# Patient Record
Sex: Male | Born: 1985 | Race: White | Hispanic: No | Marital: Single | Smoking: Former smoker
Health system: Southern US, Community
[De-identification: ages and names within clinical notes are randomized; demographics above are authoritative.]

## PROBLEM LIST (undated history)

## (undated) DIAGNOSIS — R32 Unspecified urinary incontinence: Secondary | ICD-10-CM

## (undated) DIAGNOSIS — Q239 Congenital malformation of aortic and mitral valves, unspecified: Secondary | ICD-10-CM

## (undated) DIAGNOSIS — Q238 Other congenital malformations of aortic and mitral valves: Secondary | ICD-10-CM

## (undated) DIAGNOSIS — G4700 Insomnia, unspecified: Secondary | ICD-10-CM

## (undated) DIAGNOSIS — K5090 Crohn's disease, unspecified, without complications: Secondary | ICD-10-CM

## (undated) DIAGNOSIS — K2960 Other gastritis without bleeding: Secondary | ICD-10-CM

## (undated) DIAGNOSIS — I351 Nonrheumatic aortic (valve) insufficiency: Secondary | ICD-10-CM

## (undated) DIAGNOSIS — I10 Essential (primary) hypertension: Secondary | ICD-10-CM

## (undated) DIAGNOSIS — K279 Peptic ulcer, site unspecified, unspecified as acute or chronic, without hemorrhage or perforation: Secondary | ICD-10-CM

## (undated) DIAGNOSIS — K449 Diaphragmatic hernia without obstruction or gangrene: Secondary | ICD-10-CM

## (undated) DIAGNOSIS — G43909 Migraine, unspecified, not intractable, without status migrainosus: Secondary | ICD-10-CM

## (undated) DIAGNOSIS — K219 Gastro-esophageal reflux disease without esophagitis: Secondary | ICD-10-CM

## (undated) DIAGNOSIS — R3915 Urgency of urination: Secondary | ICD-10-CM

## (undated) HISTORY — PX: COLONOSCOPY: SHX174

## (undated) HISTORY — PX: ESOPHAGOGASTRODUODENOSCOPY: SHX1529

## (undated) HISTORY — DX: Unspecified urinary incontinence: R32

## (undated) HISTORY — DX: Other gastritis without bleeding: K29.60

## (undated) HISTORY — PX: APPENDECTOMY: SHX54

## (undated) HISTORY — DX: Insomnia, unspecified: G47.00

## (undated) HISTORY — DX: Migraine, unspecified, not intractable, without status migrainosus: G43.909

## (undated) HISTORY — DX: Diaphragmatic hernia without obstruction or gangrene: K44.9

## (undated) HISTORY — DX: Crohn's disease, unspecified, without complications: K50.90

## (undated) HISTORY — DX: Urgency of urination: R39.15

---
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
## 2006-08-08 ENCOUNTER — Emergency Department (HOSPITAL_COMMUNITY): Admission: EM | Admit: 2006-08-08 | Discharge: 2006-08-08 | Payer: Self-pay | Admitting: Emergency Medicine

## 2010-07-03 HISTORY — PX: CHOLECYSTECTOMY: SHX55

## 2012-04-24 ENCOUNTER — Encounter (HOSPITAL_COMMUNITY): Payer: Self-pay | Admitting: *Deleted

## 2012-04-24 ENCOUNTER — Inpatient Hospital Stay (HOSPITAL_COMMUNITY)
Admission: AD | Admit: 2012-04-24 | Discharge: 2012-04-28 | DRG: 392 | Disposition: A | Discharge status: Home / Self Care | Payer: 59 | Source: Other Acute Inpatient Hospital | Attending: Internal Medicine | Admitting: Internal Medicine

## 2012-04-24 DIAGNOSIS — K5000 Crohn's disease of small intestine without complications: Secondary | ICD-10-CM | POA: Diagnosis present

## 2012-04-24 DIAGNOSIS — K219 Gastro-esophageal reflux disease without esophagitis: Secondary | ICD-10-CM | POA: Diagnosis present

## 2012-04-24 DIAGNOSIS — K5289 Other specified noninfective gastroenteritis and colitis: Principal | ICD-10-CM | POA: Diagnosis present

## 2012-04-24 DIAGNOSIS — I10 Essential (primary) hypertension: Secondary | ICD-10-CM | POA: Diagnosis present

## 2012-04-24 HISTORY — DX: Nonrheumatic aortic (valve) insufficiency: I35.1

## 2012-04-24 HISTORY — DX: Congenital malformation of aortic and mitral valves, unspecified: Q23.9

## 2012-04-24 HISTORY — DX: Peptic ulcer, site unspecified, unspecified as acute or chronic, without hemorrhage or perforation: K27.9

## 2012-04-24 HISTORY — DX: Gastro-esophageal reflux disease without esophagitis: K21.9

## 2012-04-24 HISTORY — DX: Other congenital malformations of aortic and mitral valves: Q23.8

## 2012-04-24 HISTORY — DX: Essential (primary) hypertension: I10

## 2012-04-24 MED ORDER — MORPHINE SULFATE 2 MG/ML IJ SOLN
2.0000 mg | INTRAMUSCULAR | Status: DC | PRN
  Administered 2012-04-24 – 2012-04-26 (×2): 2 mg via INTRAVENOUS
  Filled 2012-04-24 (×2): qty 1

## 2012-04-24 MED ORDER — PANTOPRAZOLE SODIUM 40 MG IV SOLR
40.0000 mg | Freq: Every day | INTRAVENOUS | Status: DC
  Administered 2012-04-24 – 2012-04-26 (×3): 40 mg via INTRAVENOUS
  Filled 2012-04-24 (×4): qty 40

## 2012-04-25 ENCOUNTER — Encounter (HOSPITAL_COMMUNITY): Payer: Self-pay | Admitting: Internal Medicine

## 2012-04-25 ENCOUNTER — Observation Stay (HOSPITAL_COMMUNITY): Payer: 59

## 2012-04-25 DIAGNOSIS — I10 Essential (primary) hypertension: Secondary | ICD-10-CM | POA: Diagnosis present

## 2012-04-25 DIAGNOSIS — K219 Gastro-esophageal reflux disease without esophagitis: Secondary | ICD-10-CM | POA: Diagnosis present

## 2012-04-25 DIAGNOSIS — K5000 Crohn's disease of small intestine without complications: Secondary | ICD-10-CM | POA: Diagnosis present

## 2012-04-25 LAB — COMPREHENSIVE METABOLIC PANEL
ALT: 20 U/L (ref 0–53)
ALT: 22 U/L (ref 0–53)
Albumin: 2.9 g/dL — ABNORMAL LOW (ref 3.5–5.2)
Alkaline Phosphatase: 49 U/L (ref 39–117)
Alkaline Phosphatase: 51 U/L (ref 39–117)
BUN: 7 mg/dL (ref 6–23)
CO2: 25 mEq/L (ref 19–32)
CO2: 27 mEq/L (ref 19–32)
Chloride: 100 mEq/L (ref 96–112)
Chloride: 103 mEq/L (ref 96–112)
GFR calc Af Amer: >90 mL/min (ref >90)
GFR calc Af Amer: >90 mL/min (ref >90)
GFR calc non Af Amer: >90 mL/min (ref >90)
Glucose, Bld: 95 mg/dL (ref 70–99)
Potassium: 3.5 mEq/L (ref 3.5–5.1)
Sodium: 135 mEq/L (ref 135–145)
Sodium: 139 mEq/L (ref 135–145)
Total Bilirubin: 0.5 mg/dL (ref 0.3–1.2)
Total Protein: 6.6 g/dL (ref 6.0–8.3)
Total Protein: 6.9 g/dL (ref 6.0–8.3)

## 2012-04-25 LAB — CBC WITH DIFFERENTIAL/PLATELET
Basophils Absolute: 0 10*3/uL (ref 0.0–0.1)
Basophils Relative: 0 % (ref 0–1)
Eosinophils Absolute: 0 10*3/uL (ref 0.0–0.7)
Hemoglobin: 13.4 g/dL (ref 13.0–17.0)
Lymphs Abs: 1.1 10*3/uL (ref 0.7–4.0)
MCV: 93.4 fL (ref 78.0–100.0)
Monocytes Relative: 18 % — ABNORMAL HIGH (ref 3–12)
Platelets: 138 10*3/uL — ABNORMAL LOW (ref 150–400)
RBC: 4.23 MIL/uL (ref 4.22–5.81)

## 2012-04-25 LAB — CBC
HCT: 40.4 % (ref 39.0–52.0)
MCHC: 34.7 g/dL (ref 30.0–36.0)
Platelets: 156 10*3/uL (ref 150–400)
RDW: 11.7 % (ref 11.5–15.5)
WBC: 16.7 10*3/uL — ABNORMAL HIGH (ref 4.0–10.5)

## 2012-04-25 LAB — URINALYSIS, ROUTINE W REFLEX MICROSCOPIC
Hgb urine dipstick: NEGATIVE
Nitrite: POSITIVE — AB
Protein, ur: 30 mg/dL — AB
Specific Gravity, Urine: 1.037 — ABNORMAL HIGH (ref 1.005–1.030)
Urobilinogen, UA: 1 mg/dL (ref 0.0–1.0)

## 2012-04-25 LAB — URINE MICROSCOPIC-ADD ON

## 2012-04-25 LAB — HIV ANTIBODY (ROUTINE TESTING W REFLEX): HIV: NONREACTIVE

## 2012-04-25 LAB — MAGNESIUM: Magnesium: 2 mg/dL (ref 1.5–2.5)

## 2012-04-25 MED ORDER — CHLORHEXIDINE GLUCONATE 0.12 % MT SOLN: 15.0000 mL | Freq: Two times a day (BID) | OROMUCOSAL | Status: DC

## 2012-04-25 MED ORDER — ONDANSETRON HCL 4 MG PO TABS
4.0000 mg | ORAL_TABLET | Freq: Four times a day (QID) | ORAL | Status: DC | PRN
  Administered 2012-04-25 – 2012-04-27 (×2): 4 mg via ORAL
  Filled 2012-04-25 (×2): qty 1

## 2012-04-25 MED ORDER — PEG 3350-KCL-NA BICARB-NACL 420 G PO SOLR
4000.0000 mL | Freq: Once | ORAL | Status: AC
  Administered 2012-04-25: 4000 mL via ORAL
  Filled 2012-04-25: qty 4000

## 2012-04-25 MED ORDER — SODIUM CHLORIDE 0.9 % IV SOLN: INTRAVENOUS | Status: DC

## 2012-04-25 MED ORDER — SODIUM PHOSPHATE 3 MMOLE/ML IV SOLN
20.0000 mmol | Freq: Once | INTRAVENOUS | Status: DC
  Administered 2012-04-25: 20 mmol via INTRAVENOUS
  Filled 2012-04-25: qty 6.67

## 2012-04-25 MED ORDER — ACETAMINOPHEN 650 MG RE SUPP: 650.0000 mg | Freq: Four times a day (QID) | RECTAL | Status: DC | PRN

## 2012-04-25 MED ORDER — ONDANSETRON HCL 4 MG/2ML IJ SOLN
4.0000 mg | Freq: Four times a day (QID) | INTRAMUSCULAR | Status: DC | PRN
  Administered 2012-04-25 – 2012-04-26 (×3): 4 mg via INTRAVENOUS
  Filled 2012-04-25 (×3): qty 2

## 2012-04-25 MED ORDER — ENOXAPARIN SODIUM 40 MG/0.4ML ~~LOC~~ SOLN
40.0000 mg | Freq: Every day | SUBCUTANEOUS | Status: DC
Start: 2012-04-25 — End: 2012-04-28
  Administered 2012-04-25 – 2012-04-28 (×4): 40 mg via SUBCUTANEOUS
  Filled 2012-04-25 (×4): qty 0.4

## 2012-04-25 MED ORDER — POTASSIUM CHLORIDE IN NACL 20-0.9 MEQ/L-% IV SOLN
INTRAVENOUS | Status: AC
  Administered 2012-04-25 (×2): via INTRAVENOUS
  Administered 2012-04-26: 1000 mL via INTRAVENOUS
  Administered 2012-04-27: 06:00:00 via INTRAVENOUS
  Filled 2012-04-25 (×6): qty 1000

## 2012-04-25 MED ORDER — DOCUSATE SODIUM 100 MG PO CAPS
100.0000 mg | ORAL_CAPSULE | Freq: Two times a day (BID) | ORAL | Status: DC
  Administered 2012-04-25 – 2012-04-28 (×4): 100 mg via ORAL
  Filled 2012-04-25 (×6): qty 1

## 2012-04-25 MED ORDER — PROMETHAZINE HCL 25 MG/ML IJ SOLN
12.5000 mg | Freq: Four times a day (QID) | INTRAMUSCULAR | Status: DC | PRN
  Administered 2012-04-26: 12.5 mg via INTRAVENOUS
  Filled 2012-04-25 (×2): qty 1

## 2012-04-25 MED ORDER — IOHEXOL 300 MG/ML  SOLN
80.0000 mL | Freq: Once | INTRAMUSCULAR | Status: AC | PRN
  Administered 2012-04-25: 80 mL via INTRAVENOUS

## 2012-04-25 MED ORDER — SODIUM CHLORIDE 0.9 % IJ SOLN
3.0000 mL | Freq: Two times a day (BID) | INTRAMUSCULAR | Status: DC
  Administered 2012-04-26 (×2): 3 mL via INTRAVENOUS

## 2012-04-25 MED ORDER — ACETAMINOPHEN 325 MG PO TABS: 650.0000 mg | ORAL_TABLET | Freq: Four times a day (QID) | ORAL | Status: DC | PRN

## 2012-04-25 MED ORDER — HYDROCODONE-ACETAMINOPHEN 5-325 MG PO TABS
1.0000 | ORAL_TABLET | ORAL | Status: DC | PRN
  Administered 2012-04-25 – 2012-04-26 (×3): 2 via ORAL
  Administered 2012-04-27: 1 via ORAL
  Administered 2012-04-27: 2 via ORAL
  Administered 2012-04-28: 1 via ORAL
  Filled 2012-04-25 (×3): qty 2
  Filled 2012-04-25: qty 1
  Filled 2012-04-25 (×2): qty 2

## 2012-04-25 MED ORDER — IOHEXOL 300 MG/ML  SOLN
20.0000 mL | INTRAMUSCULAR | Status: AC
  Administered 2012-04-25 (×2): 20 mL via ORAL

## 2012-04-25 MED ORDER — METRONIDAZOLE IN NACL 5-0.79 MG/ML-% IV SOLN
500.0000 mg | Freq: Three times a day (TID) | INTRAVENOUS | Status: DC
  Administered 2012-04-25 – 2012-04-27 (×8): 500 mg via INTRAVENOUS
  Filled 2012-04-25 (×10): qty 100

## 2012-04-25 MED ORDER — SODIUM CHLORIDE 0.9 % IV SOLN
INTRAVENOUS | Status: DC
  Administered 2012-04-25: 03:00:00 via INTRAVENOUS

## 2012-04-25 MED ORDER — SODIUM GLYCEROPHOSPHATE 1 MMOLE/ML IV SOLN
20.0000 mmol | Freq: Once | INTRAVENOUS | Status: AC
  Administered 2012-04-25: 20 mmol via INTRAVENOUS
  Filled 2012-04-25: qty 20

## 2012-04-25 MED ORDER — BIOTENE DRY MOUTH MT LIQD: 15.0000 mL | Freq: Two times a day (BID) | OROMUCOSAL | Status: DC

## 2012-04-25 MED ORDER — CIPROFLOXACIN IN D5W 400 MG/200ML IV SOLN
400.0000 mg | Freq: Two times a day (BID) | INTRAVENOUS | Status: DC
  Administered 2012-04-25 – 2012-04-26 (×5): 400 mg via INTRAVENOUS
  Filled 2012-04-25 (×7): qty 200

## 2012-04-25 NOTE — Consult Note (Signed)
Unassigned Consult  Reason for Consult: ABM pain and abnormal CT scan Referring Physician: [facility]  Lynnell Grain HPI: This is a 26 year old male admitted for TI inflammation.  He started to have right sided pain this past Friday. It was progressive to the point that he required hospitalization.  At home he had complaints of fever up to 101 and he presented to [facility].  Cipro and Flagyl were started but his fever did not resolve and he was subsequently sent to [facility] for further evaluation.  A CT scan reveals inflammation in the TI and it may be secondary to IBD versus infectious etiologies.  No prior history of abdominal pain and he denies any diarrhea.  He was in [place] recently, but he was not ill during his trip.  Past Medical History  Diagnosis Date  . GERD (gastroesophageal reflux disease)   . PUD (peptic ulcer disease)   . HTN (hypertension)   . Aortic valve cusp abnormality   . Aortic insufficiency     Past Surgical History  Procedure Date  . Cholecystectomy 2012    Family History  Problem Relation Age of Onset  . Hypertension Father   . Hypertension Mother   . Diabetes Mother     Social History:  reports that he has quit smoking. He does not have any smokeless tobacco history on file. He reports that he drinks alcohol. He reports that he does not use illicit drugs.  Allergies:  Allergies  Allergen Reactions  . Shellfish Allergy Anaphylaxis and Hives  . Aspirin Other (See Comments)    Stomach bleeding  . Erythromycin Hives    Medications:  Scheduled:   . ciprofloxacin  400 mg Intravenous BID  . docusate sodium  100 mg Oral BID  . enoxaparin (LOVENOX) injection  40 mg Subcutaneous Daily  . iohexol  20 mL Oral Q1 Hr x 2  . metronidazole  500 mg Intravenous Q8H  . pantoprazole (PROTONIX) IV  40 mg Intravenous QHS  . polyethylene glycol-electrolytes  4,000 mL Oral Once  . sodium chloride  3 mL Intravenous Q12H  . sodium  glycerophosphate 0.9% NaCl IVPB  20 mmol Intravenous Once  . DISCONTD: antiseptic oral rinse  15 mL Mouth Rinse q12n4p  . DISCONTD: chlorhexidine  15 mL Mouth Rinse BID  . DISCONTD: sodium phosphate  Dextrose 5% IVPB  20 mmol Intravenous Once   Continuous:   . sodium chloride    . 0.9 % NaCl with KCl 20 mEq / L 125 mL/hr at 04/25/12 1024  . DISCONTD: sodium chloride 125 mL/hr at 04/25/12 0257    Results for orders placed during the hospital encounter of 04/24/12 (from the past 24 hour(s))  CBC WITH DIFFERENTIAL     Status: Abnormal   Collection Time   04/25/12 12:10 AM      Component Value Range   WBC 14.4 (*) 4.0 - 10.5 K/uL   RBC 4.23  4.22 - 5.81 MIL/uL   Hemoglobin 13.4  13.0 - 17.0 g/dL   HCT 16.1  09.6 - 04.5 %   MCV 93.4  78.0 - 100.0 fL   MCH 31.7  26.0 - 34.0 pg   MCHC 33.9  30.0 - 36.0 g/dL   RDW 40.9  81.1 - 91.4 %   Platelets 138 (*) 150 - 400 K/uL   Neutrophils Relative 74  43 - 77 %   Neutro Abs 10.6 (*) 1.7 - 7.7 K/uL   Lymphocytes Relative 8 (*)  12 - 46 %   Lymphs Abs 1.1  0.7 - 4.0 K/uL   Monocytes Relative 18 (*) 3 - 12 %   Monocytes Absolute 2.6 (*) 0.1 - 1.0 K/uL   Eosinophils Relative 0  0 - 5 %   Eosinophils Absolute 0.0  0.0 - 0.7 K/uL   Basophils Relative 0  0 - 1 %   Basophils Absolute 0.0  0.0 - 0.1 K/uL  COMPREHENSIVE METABOLIC PANEL     Status: Abnormal   Collection Time   04/25/12 12:10 AM      Component Value Range   Sodium 139  135 - 145 mEq/L   Potassium 3.8  3.5 - 5.1 mEq/L   Chloride 103  96 - 112 mEq/L   CO2 27  19 - 32 mEq/L   Glucose, Bld 98  70 - 99 mg/dL   BUN 7  6 - 23 mg/dL   Creatinine, Ser 8.65  0.50 - 1.35 mg/dL   Calcium 8.8  8.4 - 78.4 mg/dL   Total Protein 6.6  6.0 - 8.3 g/dL   Albumin 2.9 (*) 3.5 - 5.2 g/dL   AST 25  0 - 37 U/L   ALT 20  0 - 53 U/L   Alkaline Phosphatase 49  39 - 117 U/L   Total Bilirubin 0.5  0.3 - 1.2 mg/dL   GFR calc non Af Amer >90  >90 mL/min   GFR calc Af Amer >90  >90 mL/min  SEDIMENTATION  RATE     Status: Abnormal   Collection Time   04/25/12 12:10 AM      Component Value Range   Sed Rate 48 (*) 0 - 16 mm/hr  CK     Status: Normal   Collection Time   04/25/12 12:10 AM      Component Value Range   Total CK 80  7 - 232 U/L  LACTATE DEHYDROGENASE     Status: Abnormal   Collection Time   04/25/12 12:10 AM      Component Value Range   LDH 305 (*) 94 - 250 U/L  LACTIC ACID, PLASMA     Status: Normal   Collection Time   04/25/12 12:11 AM      Component Value Range   Lactic Acid, Venous 1.2  0.5 - 2.2 mmol/L  URINALYSIS, ROUTINE W REFLEX MICROSCOPIC     Status: Abnormal   Collection Time   04/25/12  1:32 AM      Component Value Range   Color, Urine AMBER (*) YELLOW   APPearance CLEAR  CLEAR   Specific Gravity, Urine 1.037 (*) 1.005 - 1.030   pH 6.0  5.0 - 8.0   Glucose, UA NEGATIVE  NEGATIVE mg/dL   Hgb urine dipstick NEGATIVE  NEGATIVE   Bilirubin Urine SMALL (*) NEGATIVE   Ketones, ur >80 (*) NEGATIVE mg/dL   Protein, ur 30 (*) NEGATIVE mg/dL   Urobilinogen, UA 1.0  0.0 - 1.0 mg/dL   Nitrite POSITIVE (*) NEGATIVE   Leukocytes, UA SMALL (*) NEGATIVE  URINE MICROSCOPIC-ADD ON     Status: Normal   Collection Time   04/25/12  1:32 AM      Component Value Range   Squamous Epithelial / LPF RARE  RARE   WBC, UA 3-6  <3 WBC/hpf   RBC / HPF 0-2  <3 RBC/hpf   Bacteria, UA RARE  RARE   Urine-Other MUCOUS PRESENT    MAGNESIUM     Status: Normal   Collection  Time   04/25/12  6:35 AM      Component Value Range   Magnesium 2.0  1.5 - 2.5 mg/dL  PHOSPHORUS     Status: Abnormal   Collection Time   04/25/12  6:35 AM      Component Value Range   Phosphorus 0.8 (*) 2.3 - 4.6 mg/dL  TSH     Status: Normal   Collection Time   04/25/12  6:35 AM      Component Value Range   TSH 0.676  0.350 - 4.500 uIU/mL  COMPREHENSIVE METABOLIC PANEL     Status: Abnormal   Collection Time   04/25/12  6:35 AM      Component Value Range   Sodium 135  135 - 145 mEq/L   Potassium 3.5   3.5 - 5.1 mEq/L   Chloride 100  96 - 112 mEq/L   CO2 25  19 - 32 mEq/L   Glucose, Bld 95  70 - 99 mg/dL   BUN 7  6 - 23 mg/dL   Creatinine, Ser 9.14  0.50 - 1.35 mg/dL   Calcium 8.6  8.4 - 78.2 mg/dL   Total Protein 6.9  6.0 - 8.3 g/dL   Albumin 3.1 (*) 3.5 - 5.2 g/dL   AST 28  0 - 37 U/L   ALT 22  0 - 53 U/L   Alkaline Phosphatase 51  39 - 117 U/L   Total Bilirubin 0.5  0.3 - 1.2 mg/dL   GFR calc non Af Amer >90  >90 mL/min   GFR calc Af Amer >90  >90 mL/min  CBC     Status: Abnormal   Collection Time   04/25/12  6:35 AM      Component Value Range   WBC 16.7 (*) 4.0 - 10.5 K/uL   RBC 4.39  4.22 - 5.81 MIL/uL   Hemoglobin 14.0  13.0 - 17.0 g/dL   HCT 95.6  21.3 - 08.6 %   MCV 92.0  78.0 - 100.0 fL   MCH 31.9  26.0 - 34.0 pg   MCHC 34.7  30.0 - 36.0 g/dL   RDW 57.8  46.9 - 62.9 %   Platelets 156  150 - 400 K/uL  HIV ANTIBODY (ROUTINE TESTING)     Status: Normal   Collection Time   04/25/12  6:35 AM      Component Value Range   HIV NON REACTIVE  NON REACTIVE     Ct Abdomen Pelvis W Contrast  04/25/2012  *RADIOLOGY REPORT*  Clinical Data: Right lower quadrant pain.  Nausea vomiting.  High fever for 1 month.  Foreign travel 1 month ago.  History of cholecystectomy.  Hypertension.  Gastroesophageal reflux disease. Peptic ulcer disease.  CT ABDOMEN AND PELVIS WITH CONTRAST  Technique:  Multidetector CT imaging of the abdomen and pelvis was performed following the standard protocol during bolus administration of intravenous contrast.  Contrast: 80mL OMNIPAQUE IOHEXOL 300 MG/ML  SOLN  Comparison: Plain film 04/24/2012.  CT of 04/22/2012 from [facility].  Findings: Lung bases:  Minimal dependent subsegmental atelectasis. Heart size upper normal, without pericardial or pleural effusion.  Abdomen/pelvis:  Normal liver.  A splenule.  Normal stomach, pancreas.  Cholecystectomy with mildly prominent cystic duct remnant, unchanged.  No surrounding inflammation.  Image 23. Normal biliary  tract, adrenal glands, kidneys.  No retroperitoneal or retrocrural adenopathy.  Normal colon.  There is persistent wall thickening involving the terminal and distal ileum, including on image 57.  This is  mildly improved since the prior.  There are prominent nodes in the adjacent ileocolic mesentery.  The appendix is normal, including on image 58.  The proximal small bowel is unremarkable, without pneumatosis or free intraperitoneal air.  No pelvic adenopathy.    Normal urinary bladder and prostate. There is new small volume pelvic fluid, including on image 66 in the left hemi pelvis and image 76 in the pelvic cul-de-sac.  No abscess.  Bones/Musculoskeletal:  No acute osseous abnormality.  Sacroiliac joints within normal limits; no evidence of sacroiliitis.  IMPRESSION:  1.  Slight improvement in distal and terminal ileitis.  This could be infectious or represent Crohn's disease. 2.  New small volume pelvic fluid.   Original Report Authenticated By: Consuello Bossier, M.D.     ROS:  As stated above in the HPI otherwise negative.  Blood pressure 106/67, pulse 76, temperature 98 F (36.7 C), temperature source Oral, resp. rate 18, height 5\' 6"  (1.676 m), weight 80.513 kg (177 lb 8 oz), SpO2 98.00%.    PE: Gen: NAD, Alert and Oriented HEENT:  [place]/AT, EOMI Neck: Supple, no LAD Lungs: CTA Bilaterally CV: RRR without M/G/R ABM: Soft, diffusely tender, but more tender in the RLQ, +BS Ext: No C/C/E  Assessment/Plan: 1) Ileitis. 2) ABM pain.   I have a suspicion that he had Crohn's disease.  Further evaluation will be performed with a colonoscopy.  Plan: 1) Colonoscopy tomorrow.  Geral Coker D 04/25/2012, 5:13 PM

## 2012-04-25 NOTE — H&P (Signed)
PCP:  Doreen Beam Cardiologist : [phone]  Chief Complaint:   Transferred from [facility] hospital  HPI: Kurt Lawson is a 26 y.o. male   has no past medical history on file.   Presented to his PCP on 04/22/12 With sharp abdominal pain, nausea and diarrhea after a trip to [place] on a cruise ship one month ago. He never actually ate any food in [place].  His hx is significant for PUD and GERD.  HE was found to be febrile with rebound tenderness in right lower quadrant and admitted to [facility] on IV abx (cipro and flagyl). WBC was noted to be 14.8. CT scan showed terminal Ileum inflammation but appendix appeared wnl. Surgery was consulted and felt this could be enteritis vs. Inflammatory bowel disease. Amaebic Typhlitis was also considered due to recent trip to [place]. Upper GI follow through was ordered but had to be canceled due to residual contrast. Patient was transferred to [facility] for GI and ID consultation.  He ever had only three loose stools never bloody but rather watery. He have not had any BM for the past 3 days despite Mag Citrate. His fever has persisted. He have had high fevers up to 103 every day. No family hx of IBD. He has hx of arthritis in his back. Today when his fever went up he became severely tachycardic and became unresponsive and very short of breath. HE has had very little urine output and it has been very dark. He denies any chest pain or leg swelling.   Review of Systems:    Pertinent positives include:Fevers, chills, fatigue, abdominal pain, nausea,   Constitutional:  No weight loss, night sweats, weight loss  HEENT:  No headaches, Difficulty swallowing,Tooth/dental problems,Sore throat,  No sneezing, itching, ear ache, nasal congestion, post nasal drip,  Cardio-vascular:  No chest pain, Orthopnea, PND, anasarca, dizziness, palpitations.no Bilateral lower extremity swelling  GI:  No heartburn, indigestion, vomiting, diarrhea, change in bowel habits,  loss of appetite, melena, blood in stool, hematemesis Resp:  no shortness of breath at rest. No dyspnea on exertion, No excess mucus, no productive cough, No non-productive cough, No coughing up of blood.No change in color of mucus.No wheezing. Skin:  no rash or lesions. No jaundice GU:  no dysuria, change in color of urine, no urgency or frequency. No straining to urinate.  No flank pain.  Musculoskeletal:  No joint pain or no joint swelling. No decreased range of motion. No back pain.  Psych:  No change in mood or affect. No depression or anxiety. No memory loss.  Neuro: no localizing neurological complaints, no tingling, no weakness, no double vision, no gait abnormality, no slurred speech, no confusion  Otherwise ROS are negative except for above, 10 systems were reviewed  Past Medical History: History reviewed. No pertinent past medical history. History reviewed. No pertinent past surgical history.   Medications: Prior to Admission medications   Medication Sig Start Date End Date Taking? Authorizing Provider  acetaminophen (TYLENOL) 325 MG tablet Take 650 mg by mouth daily.   Yes Historical Provider, MD  amLODipine (NORVASC) 5 MG tablet Take 5 mg by mouth daily.   Yes Historical Provider, MD  dexlansoprazole (DEXILANT) 60 MG capsule Take 60 mg by mouth daily.   Yes Historical Provider, MD    Allergies:   Allergies  Allergen Reactions  . Shellfish Allergy Anaphylaxis and Hives  . Aspirin Other (See Comments)    Stomach bleeding  . Erythromycin Hives    Social  History:  Ambulatory  Independently  Lives at  home   does not have a smoking history on file. He does not have any smokeless tobacco history on file. He reports that he does not use illicit drugs.   Family History: family history is not on file.    Physical Exam: Patient Vitals for the past 24 hrs:  BP Temp Pulse Resp SpO2 Height Weight  04/24/12 2225 123/52 mmHg 98.3 F (36.8 C) 82  18  96 % 5\' 6"   (1.676 m) 80.513 kg (177 lb 8 oz)    1. General:  in No Acute distress 2. Psychological: Alert and  Oriented 3. Head/ENT:    Dry Mucous Membranes                          Head Non traumatic, neck supple                          Normal Dentition 4. SKIN:  decreased Skin turgor,  Skin clean Dry and intact no rash 5. Heart: Regular rate and rhythm no Murmur, Rub or gallop 6. Lungs: Clear to auscultation bilaterally, no wheezes or crackles   7. Abdomen: Soft, Right lower quadrant tenderness and rebound, Non distended 8. Lower extremities: no clubbing, cyanosis, or edema 9. Neurologically Grossly intact, moving all 4 extremities equally 10. MSK: Normal range of motion  body mass index is 28.65 kg/(m^2).   Labs on Admission:   [facility] 04/25/12 0010  NA 139  K 3.8  CL 103  CO2 27  GLUCOSE 98  BUN 7  CREATININE 0.86  CALCIUM 8.8  MG --  PHOS --    Basename 04/25/12 0010  AST 25  ALT 20  ALKPHOS 49  BILITOT 0.5  PROT 6.6  ALBUMIN 2.9*   No results found for this basename: LIPASE:2,AMYLASE:2 in the last 72 hours  Basename 04/25/12 0010  WBC 14.4*  NEUTROABS 10.6*  HGB 13.4  HCT 39.5  MCV 93.4  PLT 138*   No results found for this basename: CKTOTAL:3,CKMB:3,CKMBINDEX:3,TROPONINI:3 in the last 72 hours No results found for this basename: TSH,T4TOTAL,FREET3,T3FREE,THYROIDAB in the last 72 hours No results found for this basename: VITAMINB12:2,FOLATE:2,FERRITIN:2,TIBC:2,IRON:2,RETICCTPCT:2 in the last 72 hours No results found for this basename: HGBA1C    CrCl is unknown because no creatinine reading has been taken. ABG No results found for this basename: phart,  pco2,  po2,  hco3,  tco2,  acidbasedef,  o2sat     No results found for this basename: DDIMER     Other results:  I have pearsonaly reviewed this: ECG REPORT  Rate: 115  Rhythm: sinus tachycardia, ST&T Change:  S waves noted Flipped twave in lead III   Cultures: No results found for this  basename: sdes, specrequest, cult, reptstatus       Radiological Exams on Admission: No results found.  Chart has been reviewed  Assessment/Plan  26 yo M recently hospitalized with terminal ileitis being transferred to [facility] for further evaluation   Present on Admission:  .Ileitis, terminal - given persistent fevers and pain, will order CT of the abd/ pelvis to further evaluate and to exclude abscess formation. GI consult and ID in AM. If surgical indication would also call surgery. For now will continue cipro and flagyl. Will obtain HIV serologies and blood cultures if febrile again. Diarrhea has resolved hold off on stool studies. Patient appear to have very dark urine will  obtain UA and check for serum CK levels  Will monitor on telemetry given intermittent tachycardia likely due to fever. No worrisome Shortness of breath or chest pain.  Marland KitchenHypertension - hold off home medications for now .GERD (gastroesophageal reflux disease) - protonix   Prophylaxis:   Lovenox, Protonix  CODE STATUS: FULL CODE  Other plan as per orders.  I have spent a total of 60 min on this admission  Vittoria Noreen 04/25/2012, 12:03 AM

## 2012-04-25 NOTE — Progress Notes (Signed)
[facility]                                                                                Patient Demographics  Kurt Lawson, is a 26 y.o. male  WUJ:[id number]  NFA:[id number]  DOB - [birth year]  Admit date - 04/24/2012  Admitting Physician Kurt Doyne, MD  Outpatient Primary MD for the patient is DEFAULT,PROVIDER, MD  LOS - 1   No chief complaint on file.       Assessment & Plan    1. Abdominal pain, one episode of diarrhea, couple of episodes of emesis - ongoing generalized abdominal pain and persistent nausea, positive leukocytosis, ESR in 50s, CT suggestive of TIAs, highly suspicious for inflammatory bowel disease, for now continue Cipro Flagyl, no diarrhea however once stool available cultures and C. difficile PCR have been ordered, I doubt this is infectious in etiology, have discussed the case with Dr. Elnoria Lawson GI who will see the patient shortly and possibly scope him tomorrow i.e. Colonoscopy. We continue IV fluids and clear liquids for now.    2. History of GERD continue PPI   3. History of hypertension- monitor off medications.   4. Low phosphorous replaced IV, recheck BMP magnesium and phos in the morning.   Code Status: Full  Family Communication: Discussed with the patient, mother and sister bedside  Disposition Plan: Home    Procedures CT abdomen pelvis, possible colonoscopy on 04/26/2012   Consults  GI Dr. Elnoria Lawson called possible colonoscopy in the morning   Time Spent in minutes   40   Antibiotics    Anti-infectives     Start     Dose/Rate Route Frequency Ordered Stop   04/25/12 0200   metroNIDAZOLE (FLAGYL) IVPB 500 mg        500 mg 100 mL/hr over 60 Minutes Intravenous Every 8 hours 04/25/12 0052     04/25/12 0130   ciprofloxacin (CIPRO) IVPB 400 mg        400 mg 200 mL/hr over 60 Minutes Intravenous 2 times daily 04/25/12 0052            Scheduled Meds:   . ciprofloxacin  400 mg Intravenous BID  .  docusate sodium  100 mg Oral BID  . enoxaparin (LOVENOX) injection  40 mg Subcutaneous Daily  . iohexol  20 mL Oral Q1 Hr x 2  . metronidazole  500 mg Intravenous Q8H  . pantoprazole (PROTONIX) IV  40 mg Intravenous QHS  . sodium chloride  3 mL Intravenous Q12H  . sodium glycerophosphate 0.9% NaCl IVPB  20 mmol Intravenous Once  . DISCONTD: antiseptic oral rinse  15 mL Mouth Rinse q12n4p  . DISCONTD: chlorhexidine  15 mL Mouth Rinse BID  . DISCONTD: sodium phosphate  Dextrose 5% IVPB  20 mmol Intravenous Once   Continuous Infusions:   . 0.9 % NaCl with KCl 20 mEq / L 125 mL/hr at 04/25/12 1024  . DISCONTD: sodium chloride 125 mL/hr at 04/25/12 0257   PRN Meds:.acetaminophen, acetaminophen, HYDROcodone-acetaminophen, iohexol, morphine injection, ondansetron (ZOFRAN) IV, ondansetron   DVT Prophylaxis  Lovenox   Lab Results  Component Value Date   PLT 156 04/25/2012  Kurt Lawson M.D on 04/25/2012 at 10:40 AM  Between 7am to 7pm - Pager - [phone]  After 7pm go to [email or web address] - password [email or web address]  And look for the night coverage person covering for me after hours  [facility] Office  [phone]    Subjective:   Kurt Lawson today has, No headache, No chest pain, dull generalized abdominal pain with some nausea, No new weakness tingling or numbness, No Cough - SOB.   Objective:   Filed Vitals:   04/24/12 2225 04/25/12 0158 04/25/12 0623 04/25/12 0645  BP: 123/52 127/73 137/80   Pulse: 82 92    Temp: 98.3 F (36.8 C) 99.5 F (37.5 C)  99.2 F (37.3 C)  TempSrc:  Oral Oral   Resp: 18 18 18    Height: 5\' 6"  (1.676 m)     Weight: 80.513 kg (177 lb 8 oz)     SpO2: 96% 100% 95%     Wt Readings from Last 3 Encounters:  04/24/12 80.513 kg (177 lb 8 oz)     Intake/Output Summary (Last 24 hours) at 04/25/12 1040 Last data filed at 04/25/12 0626  Gross per 24 hour  Intake    883 ml  Output    450 ml  Net    433 ml    Exam Awake  Alert, Oriented X 3, No new F.N deficits, Normal affect [place].AT,PERRAL Supple Neck,No JVD, No cervical lymphadenopathy appriciated.  Symmetrical Chest wall movement, Good air movement bilaterally, CTAB RRR,No Gallops,Rubs or new Murmurs, No Parasternal Heave +ve B.Sounds, Abd Soft, mild right lower quadrant tenderness, No organomegaly appriciated, No rebound - guarding or rigidity. No Cyanosis, Clubbing or edema, No new Rash or bruise      Data Review   Micro Results No results found for this or any previous visit (from the past 240 hour(s)).  Radiology Reports Ct Abdomen Pelvis W Contrast  04/25/2012  *RADIOLOGY REPORT*  Clinical Data: Right lower quadrant pain.  Nausea vomiting.  High fever for 1 month.  Foreign travel 1 month ago.  History of cholecystectomy.  Hypertension.  Gastroesophageal reflux disease. Peptic ulcer disease.  CT ABDOMEN AND PELVIS WITH CONTRAST  Technique:  Multidetector CT imaging of the abdomen and pelvis was performed following the standard protocol during bolus administration of intravenous contrast.  Contrast: 80mL OMNIPAQUE IOHEXOL 300 MG/ML  SOLN  Comparison: Plain film 04/24/2012.  CT of 04/22/2012 from [facility].  Findings: Lung bases:  Minimal dependent subsegmental atelectasis. Heart size upper normal, without pericardial or pleural effusion.  Abdomen/pelvis:  Normal liver.  A splenule.  Normal stomach, pancreas.  Cholecystectomy with mildly prominent cystic duct remnant, unchanged.  No surrounding inflammation.  Image 23. Normal biliary tract, adrenal glands, kidneys.  No retroperitoneal or retrocrural adenopathy.  Normal colon.  There is persistent wall thickening involving the terminal and distal ileum, including on image 57.  This is mildly improved since the prior.  There are prominent nodes in the adjacent ileocolic mesentery.  The appendix is normal, including on image 58.  The proximal small bowel is unremarkable, without pneumatosis or free  intraperitoneal air.  No pelvic adenopathy.    Normal urinary bladder and prostate. There is new small volume pelvic fluid, including on image 66 in the left hemi pelvis and image 76 in the pelvic cul-de-sac.  No abscess.  Bones/Musculoskeletal:  No acute osseous abnormality.  Sacroiliac joints within normal limits; no evidence of sacroiliitis.  IMPRESSION:  1.  Slight improvement in distal and terminal ileitis.  This could be infectious or represent Crohn's disease. 2.  New small volume pelvic fluid.   Original Report Authenticated By: Consuello Bossier, M.D.     CBC  Lab 04/25/12 0635 04/25/12 0010  WBC 16.7* 14.4*  HGB 14.0 13.4  HCT 40.4 39.5  PLT 156 138*  MCV 92.0 93.4  MCH 31.9 31.7  MCHC 34.7 33.9  RDW 11.7 11.8  LYMPHSABS -- 1.1  MONOABS -- 2.6*  EOSABS -- 0.0  BASOSABS -- 0.0  BANDABS -- --    Chemistries   Lab 04/25/12 0635 04/25/12 0010  NA 135 139  K 3.5 3.8  CL 100 103  CO2 25 27  GLUCOSE 95 98  BUN 7 7  CREATININE 0.78 0.86  CALCIUM 8.6 8.8  MG 2.0 --  AST 28 25  ALT 22 20  ALKPHOS 51 49  BILITOT 0.5 0.5   ------------------------------------------------------------------------------------------------------------------ estimated creatinine clearance is 138.3 ml/min (by C-G formula based on Cr of 0.78). ------------------------------------------------------------------------------------------------------------------ No results found for this basename: HGBA1C:2 in the last 72 hours ------------------------------------------------------------------------------------------------------------------ No results found for this basename: CHOL:2,HDL:2,LDLCALC:2,TRIG:2,CHOLHDL:2,LDLDIRECT:2 in the last 72 hours ------------------------------------------------------------------------------------------------------------------ No results found for this basename: TSH,T4TOTAL,FREET3,T3FREE,THYROIDAB in the last 72  hours ------------------------------------------------------------------------------------------------------------------ No results found for this basename: VITAMINB12:2,FOLATE:2,FERRITIN:2,TIBC:2,IRON:2,RETICCTPCT:2 in the last 72 hours  Coagulation profile No results found for this basename: INR:5,PROTIME:5 in the last 168 hours  No results found for this basename: DDIMER:2 in the last 72 hours  Cardiac Enzymes No results found for this basename: CK:3,CKMB:3,TROPONINI:3,MYOGLOBIN:3 in the last 168 hours ------------------------------------------------------------------------------------------------------------------ No components found with this basename: POCBNP:3

## 2012-04-25 NOTE — Progress Notes (Signed)
Endorsed to 1st Shift RN to call MD w/ CT result.

## 2012-04-26 ENCOUNTER — Encounter (HOSPITAL_COMMUNITY)
Admission: AD | Disposition: A | Discharge status: Home / Self Care | Payer: Self-pay | Source: Other Acute Inpatient Hospital | Attending: Internal Medicine

## 2012-04-26 ENCOUNTER — Encounter (HOSPITAL_COMMUNITY): Payer: Self-pay | Admitting: *Deleted

## 2012-04-26 HISTORY — PX: COLONOSCOPY: SHX5424

## 2012-04-26 LAB — BASIC METABOLIC PANEL
Chloride: 101 mEq/L (ref 96–112)
Creatinine, Ser: 0.66 mg/dL (ref 0.50–1.35)
GFR calc Af Amer: >90 mL/min (ref >90)
GFR calc non Af Amer: >90 mL/min (ref >90)
Potassium: 3.6 mEq/L (ref 3.5–5.1)

## 2012-04-26 LAB — URINE CULTURE: Culture: NO GROWTH

## 2012-04-26 LAB — CLOSTRIDIUM DIFFICILE BY PCR: Toxigenic C. Difficile by PCR: NEGATIVE

## 2012-04-26 LAB — CBC
HCT: 37.4 % — ABNORMAL LOW (ref 39.0–52.0)
RDW: 11.9 % (ref 11.5–15.5)
WBC: 15.3 10*3/uL — ABNORMAL HIGH (ref 4.0–10.5)

## 2012-04-26 LAB — MAGNESIUM: Magnesium: 2 mg/dL (ref 1.5–2.5)

## 2012-04-26 LAB — PHOSPHORUS: Phosphorus: 1.6 mg/dL — ABNORMAL LOW (ref 2.3–4.6)

## 2012-04-26 SURGERY — COLONOSCOPY
Anesthesia: Moderate Sedation

## 2012-04-26 MED ORDER — FENTANYL CITRATE 0.05 MG/ML IJ SOLN
INTRAMUSCULAR | Status: AC
  Filled 2012-04-26: qty 4

## 2012-04-26 MED ORDER — FENTANYL CITRATE 0.05 MG/ML IJ SOLN
INTRAMUSCULAR | Status: DC | PRN
  Administered 2012-04-26 (×2): 25 ug via INTRAVENOUS
  Administered 2012-04-26 (×2): 12.5 ug via INTRAVENOUS

## 2012-04-26 MED ORDER — SODIUM CHLORIDE 0.9 % IV SOLN
INTRAVENOUS | Status: DC
  Administered 2012-04-26: 500 mL via INTRAVENOUS

## 2012-04-26 MED ORDER — MIDAZOLAM HCL 5 MG/ML IJ SOLN
INTRAMUSCULAR | Status: AC
  Filled 2012-04-26: qty 4

## 2012-04-26 MED ORDER — DIPHENHYDRAMINE HCL 50 MG/ML IJ SOLN
INTRAMUSCULAR | Status: AC
  Filled 2012-04-26: qty 1

## 2012-04-26 MED ORDER — SODIUM CHLORIDE 0.9 % IV SOLN
INTRAVENOUS | Status: DC
  Administered 2012-04-26: 250 mL via INTRAVENOUS

## 2012-04-26 MED ORDER — MIDAZOLAM HCL 5 MG/5ML IJ SOLN
INTRAMUSCULAR | Status: DC | PRN
  Administered 2012-04-26 (×2): 1 mg via INTRAVENOUS
  Administered 2012-04-26 (×2): 2 mg via INTRAVENOUS
  Administered 2012-04-26: 1 mg via INTRAVENOUS

## 2012-04-26 NOTE — Interval H&P Note (Signed)
History and Physical Interval Note:  04/26/2012 12:59 PM  Kurt Lawson  has presented today for surgery, with the diagnosis of Abnormal CT scan  The various methods of treatment have been discussed with the patient and family. After consideration of risks, benefits and other options for treatment, the patient has consented to  Procedure(s) (LRB) with comments: COLONOSCOPY (N/A) as a surgical intervention .  The patient's history has been reviewed, patient examined, no change in status, stable for surgery.  I have reviewed the patient's chart and labs.  Questions were answered to the patient's satisfaction.     Davianna Deutschman D

## 2012-04-26 NOTE — Progress Notes (Signed)
Notified Dr Donna Bernard of positive blood culture,gram + cocci,report received form [facility] hospital.

## 2012-04-26 NOTE — H&P (View-Only) (Signed)
[facility] PROGRESS NOTE  Kurt Lawson NFA:[id number] DOB: 05/12/1986 DOA: 04/24/2012 PCP: Sheila Oats, MD  Assessment/Plan: Active Problems:  Ileitis, terminal  Hypertension  GERD (gastroesophageal reflux disease)    1. Abdominal pain, one episode of diarrhea, couple of episodes of emesis - ongoing generalized abdominal pain and persistent nausea, positive leukocytosis, ESR in 50s, CT suggestive of TIAs, highly suspicious for inflammatory bowel disease, for now continue Cipro Flagyl, no diarrhea however once stool available cultures and C. difficile PCR have been ordered, I doubt this is infectious in etiology, have discussed the case with Dr. Elnoria Howard GI who will see the patient shortly and possibly scope him today i.e. Colonoscopy. We continue IV fluids and n.p.o. for colonoscopy 2. History of GERD continue PPI  3. History of hypertension- monitor off medications.  4. Low phosphorous replaced IV, recheck BMP magnesium and phos in the morning.  5. Gram-positive cocci from [place], will repeat blood culture probably a contaminant  Code Status: Full  Family Communication: Discussed with the patient, mother and sister bedside  Disposition Plan: Home  Procedures CT abdomen pelvis, possible colonoscopy on 04/26/2012  Consults GI Dr. Elnoria Howard called possible colonoscopy in the morning    HPI/Subjective: No headache, No chest pain, dull generalized abdominal pain with some nausea, No new weakness tingling or numbness, No Cough - SOB.    Objective: Filed Vitals:   04/25/12 1400 04/25/12 1758 04/25/12 2200 04/26/12 0551  BP: 106/67 124/66 130/78 129/80  Pulse: 76 76 85 84  Temp: 98 F (36.7 C) 98.5 F (36.9 C) 98.9 F (37.2 C) 99.5 F (37.5 C)  TempSrc: Oral Oral Oral Oral  Resp: 18 18 20 20   Height:      Weight:      SpO2: 98% 95% 97% 94%    Intake/Output Summary (Last 24 hours) at 04/26/12 1035 Last data filed at 04/26/12 0940  Gross per 24 hour  Intake   2446  ml  Output   1550 ml  Net    896 ml    Exam:  Gen: NAD, Alert and Oriented  HEENT: [place]/AT, EOMI  Neck: Supple, no LAD  Lungs: CTA Bilaterally  CV: RRR without M/G/R  ABM: Soft, diffusely tender, but more tender in the RLQ, +BS  Ext: No C/C/E      Data Reviewed: Basic Metabolic Panel:  Lab 04/26/12 4696 04/25/12 0635 04/25/12 0010  NA 135 135 139  K 3.6 3.5 3.8  CL 101 100 103  CO2 25 25 27   GLUCOSE 88 95 98  BUN 6 7 7   CREATININE 0.66 0.78 0.86  CALCIUM 8.6 8.6 8.8  MG 2.0 2.0 --  PHOS 1.6* 0.8* --    Liver Function Tests:  Lab 04/25/12 0635 04/25/12 0010  AST 28 25  ALT 22 20  ALKPHOS 51 49  BILITOT 0.5 0.5  PROT 6.9 6.6  ALBUMIN 3.1* 2.9*   No results found for this basename: LIPASE:5,AMYLASE:5 in the last 168 hours No results found for this basename: AMMONIA:5 in the last 168 hours  CBC:  Lab 04/26/12 0540 04/25/12 0635 04/25/12 0010  WBC 15.3* 16.7* 14.4*  NEUTROABS -- -- 10.6*  HGB 12.8* 14.0 13.4  HCT 37.4* 40.4 39.5  MCV 91.4 92.0 93.4  PLT 171 156 138*    Cardiac Enzymes:  Lab 04/25/12 0010  CKTOTAL 80  CKMB --  CKMBINDEX --  TROPONINI --   BNP (last 3 results) No results found for this basename: PROBNP:3 in the last 8760 hours  CBG: No results found for this basename: GLUCAP:5 in the last 168 hours  Recent Results (from the past 240 hour(s))  URINE CULTURE     Status: Normal   Collection Time   04/25/12  1:33 AM      Component Value Range Status Comment   Specimen Description URINE, CLEAN CATCH   Final    Special Requests NONE   Final    Culture  Setup Time 04/25/2012 01:57   Final    Colony Count NO GROWTH   Final    Culture NO GROWTH   Final    Report Status 04/26/2012 FINAL   Final      Studies: Ct Abdomen Pelvis W Contrast  04/25/2012  *RADIOLOGY REPORT*  Clinical Data: Right lower quadrant pain.  Nausea vomiting.  High fever for 1 month.  Foreign travel 1 month ago.  History of cholecystectomy.  Hypertension.   Gastroesophageal reflux disease. Peptic ulcer disease.  CT ABDOMEN AND PELVIS WITH CONTRAST  Technique:  Multidetector CT imaging of the abdomen and pelvis was performed following the standard protocol during bolus administration of intravenous contrast.  Contrast: 80mL OMNIPAQUE IOHEXOL 300 MG/ML  SOLN  Comparison: Plain film 04/24/2012.  CT of 04/22/2012 from [facility].  Findings: Lung bases:  Minimal dependent subsegmental atelectasis. Heart size upper normal, without pericardial or pleural effusion.  Abdomen/pelvis:  Normal liver.  A splenule.  Normal stomach, pancreas.  Cholecystectomy with mildly prominent cystic duct remnant, unchanged.  No surrounding inflammation.  Image 23. Normal biliary tract, adrenal glands, kidneys.  No retroperitoneal or retrocrural adenopathy.  Normal colon.  There is persistent wall thickening involving the terminal and distal ileum, including on image 57.  This is mildly improved since the prior.  There are prominent nodes in the adjacent ileocolic mesentery.  The appendix is normal, including on image 58.  The proximal small bowel is unremarkable, without pneumatosis or free intraperitoneal air.  No pelvic adenopathy.    Normal urinary bladder and prostate. There is new small volume pelvic fluid, including on image 66 in the left hemi pelvis and image 76 in the pelvic cul-de-sac.  No abscess.  Bones/Musculoskeletal:  No acute osseous abnormality.  Sacroiliac joints within normal limits; no evidence of sacroiliitis.  IMPRESSION:  1.  Slight improvement in distal and terminal ileitis.  This could be infectious or represent Crohn's disease. 2.  New small volume pelvic fluid.   Original Report Authenticated By: Consuello Bossier, M.D.     Scheduled Meds:   . ciprofloxacin  400 mg Intravenous BID  . docusate sodium  100 mg Oral BID  . enoxaparin (LOVENOX) injection  40 mg Subcutaneous Daily  . metronidazole  500 mg Intravenous Q8H  . pantoprazole (PROTONIX) IV  40 mg  Intravenous QHS  . polyethylene glycol-electrolytes  4,000 mL Oral Once  . sodium chloride  3 mL Intravenous Q12H  . sodium glycerophosphate 0.9% NaCl IVPB  20 mmol Intravenous Once   Continuous Infusions:   . sodium chloride    . 0.9 % NaCl with KCl 20 mEq / L 1,000 mL (04/26/12 0865)    Active Problems:  Ileitis, terminal  Hypertension  GERD (gastroesophageal reflux disease)    Time spent: 40 minutes   [facility]  [facility] Pager [phone]. If 8PM-8AM, please contact night-coverage at [email or web address], password [email or web address] 04/26/2012, 10:35 AM  LOS: 2 days

## 2012-04-26 NOTE — Progress Notes (Addendum)
[facility] PROGRESS NOTE  Kurt Lawson MRN:[id number] DOB: 03/27/1986 DOA: 04/24/2012 PCP: DEFAULT,PROVIDER, MD  Assessment/Plan: Active Problems:  Ileitis, terminal  Hypertension  GERD (gastroesophageal reflux disease)    1. Abdominal pain, one episode of diarrhea, couple of episodes of emesis - ongoing generalized abdominal pain and persistent nausea, positive leukocytosis, ESR in 50s, CT suggestive of TIAs, highly suspicious for inflammatory bowel disease, for now continue Cipro Flagyl, no diarrhea however once stool available cultures and C. difficile PCR have been ordered, I doubt this is infectious in etiology, have discussed the case with Dr. Hung GI who will see the patient shortly and possibly scope him today i.e. Colonoscopy. We continue IV fluids and n.p.o. for colonoscopy 2. History of GERD continue PPI  3. History of hypertension- monitor off medications.  4. Low phosphorous replaced IV, recheck BMP magnesium and phos in the morning.  5. Gram-positive cocci from [place], will repeat blood culture probably a contaminant  Code Status: Full  Family Communication: Discussed with the patient, mother and sister bedside  Disposition Plan: Home  Procedures CT abdomen pelvis, possible colonoscopy on 04/26/2012  Consults GI Dr. Hung called possible colonoscopy in the morning    HPI/Subjective: No headache, No chest pain, dull generalized abdominal pain with some nausea, No new weakness tingling or numbness, No Cough - SOB.    Objective: Filed Vitals:   04/25/12 1400 04/25/12 1758 04/25/12 2200 04/26/12 0551  BP: 106/67 124/66 130/78 129/80  Pulse: 76 76 85 84  Temp: 98 F (36.7 C) 98.5 F (36.9 C) 98.9 F (37.2 C) 99.5 F (37.5 C)  TempSrc: Oral Oral Oral Oral  Resp: 18 18 20 20  Height:      Weight:      SpO2: 98% 95% 97% 94%    Intake/Output Summary (Last 24 hours) at 04/26/12 1035 Last data filed at 04/26/12 0940  Gross per 24 hour  Intake   2446  ml  Output   1550 ml  Net    896 ml    Exam:  Gen: NAD, Alert and Oriented  HEENT: [place]/AT, EOMI  Neck: Supple, no LAD  Lungs: CTA Bilaterally  CV: RRR without M/G/R  ABM: Soft, diffusely tender, but more tender in the RLQ, +BS  Ext: No C/C/E      Data Reviewed: Basic Metabolic Panel:  Lab 04/26/12 0540 04/25/12 0635 04/25/12 0010  NA 135 135 139  K 3.6 3.5 3.8  CL 101 100 103  CO2 25 25 27  GLUCOSE 88 95 98  BUN 6 7 7  CREATININE 0.66 0.78 0.86  CALCIUM 8.6 8.6 8.8  MG 2.0 2.0 --  PHOS 1.6* 0.8* --    Liver Function Tests:  Lab 04/25/12 0635 04/25/12 0010  AST 28 25  ALT 22 20  ALKPHOS 51 49  BILITOT 0.5 0.5  PROT 6.9 6.6  ALBUMIN 3.1* 2.9*   No results found for this basename: LIPASE:5,AMYLASE:5 in the last 168 hours No results found for this basename: AMMONIA:5 in the last 168 hours  CBC:  Lab 04/26/12 0540 04/25/12 0635 04/25/12 0010  WBC 15.3* 16.7* 14.4*  NEUTROABS -- -- 10.6*  HGB 12.8* 14.0 13.4  HCT 37.4* 40.4 39.5  MCV 91.4 92.0 93.4  PLT 171 156 138*    Cardiac Enzymes:  Lab 04/25/12 0010  CKTOTAL 80  CKMB --  CKMBINDEX --  TROPONINI --   BNP (last 3 results) No results found for this basename: PROBNP:3 in the last 8760 hours     CBG: No results found for this basename: GLUCAP:5 in the last 168 hours  Recent Results (from the past 240 hour(s))  URINE CULTURE     Status: Normal   Collection Time   04/25/12  1:33 AM      Component Value Range Status Comment   Specimen Description URINE, CLEAN CATCH   Final    Special Requests NONE   Final    Culture  Setup Time 04/25/2012 01:57   Final    Colony Count NO GROWTH   Final    Culture NO GROWTH   Final    Report Status 04/26/2012 FINAL   Final      Studies: Ct Abdomen Pelvis W Contrast  04/25/2012  *RADIOLOGY REPORT*  Clinical Data: Right lower quadrant pain.  Nausea vomiting.  High fever for 1 month.  Foreign travel 1 month ago.  History of cholecystectomy.  Hypertension.   Gastroesophageal reflux disease. Peptic ulcer disease.  CT ABDOMEN AND PELVIS WITH CONTRAST  Technique:  Multidetector CT imaging of the abdomen and pelvis was performed following the standard protocol during bolus administration of intravenous contrast.  Contrast: 80mL OMNIPAQUE IOHEXOL 300 MG/ML  SOLN  Comparison: Plain film 04/24/2012.  CT of 04/22/2012 from [facility].  Findings: Lung bases:  Minimal dependent subsegmental atelectasis. Heart size upper normal, without pericardial or pleural effusion.  Abdomen/pelvis:  Normal liver.  A splenule.  Normal stomach, pancreas.  Cholecystectomy with mildly prominent cystic duct remnant, unchanged.  No surrounding inflammation.  Image 23. Normal biliary tract, adrenal glands, kidneys.  No retroperitoneal or retrocrural adenopathy.  Normal colon.  There is persistent wall thickening involving the terminal and distal ileum, including on image 57.  This is mildly improved since the prior.  There are prominent nodes in the adjacent ileocolic mesentery.  The appendix is normal, including on image 58.  The proximal small bowel is unremarkable, without pneumatosis or free intraperitoneal air.  No pelvic adenopathy.    Normal urinary bladder and prostate. There is new small volume pelvic fluid, including on image 66 in the left hemi pelvis and image 76 in the pelvic cul-de-sac.  No abscess.  Bones/Musculoskeletal:  No acute osseous abnormality.  Sacroiliac joints within normal limits; no evidence of sacroiliitis.  IMPRESSION:  1.  Slight improvement in distal and terminal ileitis.  This could be infectious or represent Crohn's disease. 2.  New small volume pelvic fluid.   Original Report Authenticated By: KYLE D. TALBOT, M.D.     Scheduled Meds:   . ciprofloxacin  400 mg Intravenous BID  . docusate sodium  100 mg Oral BID  . enoxaparin (LOVENOX) injection  40 mg Subcutaneous Daily  . metronidazole  500 mg Intravenous Q8H  . pantoprazole (PROTONIX) IV  40 mg  Intravenous QHS  . polyethylene glycol-electrolytes  4,000 mL Oral Once  . sodium chloride  3 mL Intravenous Q12H  . sodium glycerophosphate 0.9% NaCl IVPB  20 mmol Intravenous Once   Continuous Infusions:   . sodium chloride    . 0.9 % NaCl with KCl 20 mEq / L 1,000 mL (04/26/12 0822)    Active Problems:  Ileitis, terminal  Hypertension  GERD (gastroesophageal reflux disease)    Time spent: 40 minutes   Ranyah Groeneveld  [facility] Pager [phone]. If 8PM-8AM, please contact night-coverage at [email or web address], password [email or web address] 04/26/2012, 10:35 AM  LOS: 2 days          

## 2012-04-26 NOTE — Op Note (Signed)
[facility] [address] [place], [address]   OPERATIVE PROCEDURE REPORT  PATIENT: Kurt Lawson, Kurt Lawson  MR#: [id number] BIRTHDATE: [birth year]  GENDER: Male ENDOSCOPIST: Jeani Hawking, MD ASSISTANT:   Oletha Blend, technician Judithann Sauger, RN PROCEDURE DATE: 04/26/2012 PROCEDURE:   Colonoscopy with biopsy ASA CLASS:   Class II INDICATIONS:an abnormal CT. MEDICATIONS: Versed 7 mg IV and Fentanyl 75 mcg IV  DESCRIPTION OF PROCEDURE:   After the risks benefits and alternatives of the procedure were thoroughly explained, informed consent was obtained.  A digital rectal exam revealed no abnormalities of the rectum.    The Pentax Colonoscope [id number] endoscope was introduced through the anus  and advanced to the cecum, which was identified by both the appendix and ileocecal valve , No adverse events experienced.    The quality of the prep was poor. .  The instrument was then slowly withdrawn as the colon was fully examined.     FINDINGS: The prep was very poor, but with extensive washing good views of the mucosa were obtained.  Deep intubation of the TI was achieved.  The only abnormal portion of the TI was in the most distal portion.  One erosion was noted and there was some irregularity with the mucosa in this area.  Multiple cold biopsies were obtained.  No other abnormalites were noted in the colon. Retroflexed views revealed internal/external hemorrhoids.     The scope was then withdrawn from the patient and the procedure terminated.  COMPLICATIONS: There were no complications.  IMPRESSION: 1) Ileitis.  RECOMMENDATIONS: 1) Await biopsy results   _______________________________ eSignedJeani Hawking, MD 04/26/2012 1:53 PM

## 2012-04-27 LAB — BASIC METABOLIC PANEL
Chloride: 105 mEq/L (ref 96–112)
GFR calc Af Amer: >90 mL/min (ref >90)
GFR calc non Af Amer: >90 mL/min (ref >90)
Glucose, Bld: 113 mg/dL — ABNORMAL HIGH (ref 70–99)
Potassium: 3.7 mEq/L (ref 3.5–5.1)
Sodium: 137 mEq/L (ref 135–145)

## 2012-04-27 LAB — CBC
HCT: 38.8 % — ABNORMAL LOW (ref 39.0–52.0)
Hemoglobin: 13.4 g/dL (ref 13.0–17.0)
RBC: 4.2 MIL/uL — ABNORMAL LOW (ref 4.22–5.81)

## 2012-04-27 LAB — MAGNESIUM: Magnesium: 2.2 mg/dL (ref 1.5–2.5)

## 2012-04-27 MED ORDER — HYDROCODONE-ACETAMINOPHEN 5-325 MG PO TABS: 1.0000 | ORAL_TABLET | ORAL | Status: DC | PRN

## 2012-04-27 MED ORDER — CIPROFLOXACIN HCL 500 MG PO TABS: 500.0000 mg | ORAL_TABLET | Freq: Two times a day (BID) | ORAL | Status: AC

## 2012-04-27 MED ORDER — METRONIDAZOLE 500 MG PO TABS: 500.0000 mg | ORAL_TABLET | Freq: Three times a day (TID) | ORAL | Status: AC

## 2012-04-27 MED ORDER — CIPROFLOXACIN HCL 500 MG PO TABS
500.0000 mg | ORAL_TABLET | Freq: Two times a day (BID) | ORAL | Status: DC
  Administered 2012-04-27 – 2012-04-28 (×3): 500 mg via ORAL
  Filled 2012-04-27 (×5): qty 1

## 2012-04-27 MED ORDER — PANTOPRAZOLE SODIUM 40 MG PO TBEC
40.0000 mg | DELAYED_RELEASE_TABLET | Freq: Every day | ORAL | Status: DC
  Administered 2012-04-27 – 2012-04-28 (×2): 40 mg via ORAL
  Filled 2012-04-27 (×3): qty 1

## 2012-04-27 MED ORDER — METRONIDAZOLE 500 MG PO TABS
500.0000 mg | ORAL_TABLET | Freq: Three times a day (TID) | ORAL | Status: DC
  Administered 2012-04-27 – 2012-04-28 (×4): 500 mg via ORAL
  Filled 2012-04-27 (×6): qty 1

## 2012-04-27 NOTE — Progress Notes (Signed)
Subjective: Patient seems to be doing better today with somewhat less pain and nausea. Tolerating his diet well. No BM today.   Objective: Vital signs in last 24 hours: Temp:  [98 F (36.7 C)-98.3 F (36.8 C)] 98.1 F (36.7 C) (10/26 1423) Pulse Rate:  [58-73] 73  (10/26 1423) Resp:  [16-18] 16  (10/26 1423) BP: (106-117)/(59-76) 117/76 mmHg (10/26 1423) SpO2:  [96 %-99 %] 99 % (10/26 1423) Last BM Date: 04/26/12 (bowel prep)  Intake/Output from previous day: 10/25 0701 - 10/26 0700 In: 2695.8 [I.V.:2695.8] Out: -  Intake/Output this shift:    General appearance: alert, cooperative, appears stated age and no distress Resp: clear to auscultation bilaterally Cardio: regular rate and rhythm, S1, S2 normal, no murmur, click, rub or gallop GI: soft with some RLQ tenderness on palpation with gaurding but without rebound or rigidity; bowel sounds normal; no masses,  no organomegaly Extremities: extremities normal, atraumatic, no cyanosis or edema  Lab Results:  Basename 04/27/12 0620 04/26/12 0540 04/25/12 0635  WBC 9.9 15.3* 16.7*  HGB 13.4 12.8* 14.0  HCT 38.8* 37.4* 40.4  PLT 190 171 156   BMET  Basename 04/27/12 0620 04/26/12 0540 04/25/12 0635  NA 137 135 135  K 3.7 3.6 3.5  CL 105 101 100  CO2 27 25 25   GLUCOSE 113* 88 95  BUN 6 6 7   CREATININE 0.74 0.66 0.78  CALCIUM 8.8 8.6 8.6   LFT  Basename 04/25/12 0635  PROT 6.9  ALBUMIN 3.1*  AST 28  ALT 22  ALKPHOS 51  BILITOT 0.5  BILIDIR --  IBILI --   Studies/Results: No results found.  Medications: I have reviewed the patient's current medications.  Assessment/Plan: 1) Terminal ileitis-biopsy results pending. Continue present care.On Flagyl.  2) GERD on Protonix. LOS: 3 days   Adaley Kiene 04/27/2012, 7:31 PM

## 2012-04-27 NOTE — Progress Notes (Signed)
[facility] PROGRESS NOTE  Kurt Lawson WUJ:[id number] DOB: 1986-01-07 DOA: 04/24/2012 PCP: Sheila Oats, MD  Assessment/Plan: Active Problems:  Ileitis, terminal  Hypertension  GERD (gastroesophageal reflux disease)    1. Abdominal pain, one episode of diarrhea, couple of episodes of emesis - ongoing generalized abdominal pain and persistent nausea, positive leukocytosis, ESR in 50s, CT   highly suspicious for inflammatory bowel disease, for now continue Cipro Flagyl, no diarrhea however once stool available cultures and C. difficile PCR have been ordered, I doubt this is infectious in etiology, have discussed the case with Dr. Elnoria Howard GI who did colonoscopy yesterday that showed Ileitis. Change antibiotics to by mouth now, advancing diet  2. History of GERD continue PPI  3. History of hypertension- monitor off medications.  4. Low phosphorous replaced IV, recheck BMP magnesium and phos in the morning.  5. Gram-positive cocci from [place], will repeat blood culture probably a contaminant  Code Status: Full  Family Communication: Discussed with the patient, mother and sister bedside  Disposition Plan: Possible discharge home tomorrow   Procedures CT abdomen pelvis, possible colonoscopy on 04/26/2012  Consults GI Dr. Elnoria Howard called possible colonoscopy in the morning    HPI/Subjective:  No headache, No chest pain, dull generalized abdominal pain with some nausea, No new weakness tingling or numbness, No Cough - SOB.     Objective: Filed Vitals:   04/26/12 1415 04/26/12 1443 04/26/12 2237 04/27/12 0615  BP: 114/58 138/74 111/63 106/59  Pulse:  87 62 58  Temp:  99 F (37.2 C) 98.3 F (36.8 C) 98 F (36.7 C)  TempSrc:  Oral Oral Oral  Resp: 23 20 16 18   Height:      Weight:      SpO2: 98% 96% 96% 97%    Intake/Output Summary (Last 24 hours) at 04/27/12 1400 Last data filed at 04/27/12 0900  Gross per 24 hour  Intake 2555.83 ml  Output      0 ml  Net 2555.83  ml    Exam:  Gen: NAD, Alert and Oriented  HEENT: [place]/AT, EOMI  Neck: Supple, no LAD  Lungs: CTA Bilaterally  CV: RRR without M/G/R  ABM: Soft, diffusely tender, but more tender in the RLQ, +BS  Ext: No C/C/E      Data Reviewed: Basic Metabolic Panel:  Lab 04/27/12 9562 04/26/12 0540 04/25/12 0635 04/25/12 0010  NA 137 135 135 139  K 3.7 3.6 3.5 3.8  CL 105 101 100 103  CO2 27 25 25 27   GLUCOSE 113* 88 95 98  BUN 6 6 7 7   CREATININE 0.74 0.66 0.78 0.86  CALCIUM 8.8 8.6 8.6 8.8  MG 2.2 2.0 2.0 --  PHOS 2.8 1.6* 0.8* --    Liver Function Tests:  Lab 04/25/12 0635 04/25/12 0010  AST 28 25  ALT 22 20  ALKPHOS 51 49  BILITOT 0.5 0.5  PROT 6.9 6.6  ALBUMIN 3.1* 2.9*   No results found for this basename: LIPASE:5,AMYLASE:5 in the last 168 hours No results found for this basename: AMMONIA:5 in the last 168 hours  CBC:  Lab 04/27/12 0620 04/26/12 0540 04/25/12 0635 04/25/12 0010  WBC 9.9 15.3* 16.7* 14.4*  NEUTROABS -- -- -- 10.6*  HGB 13.4 12.8* 14.0 13.4  HCT 38.8* 37.4* 40.4 39.5  MCV 92.4 91.4 92.0 93.4  PLT 190 171 156 138*    Cardiac Enzymes:  Lab 04/25/12 0010  CKTOTAL 80  CKMB --  CKMBINDEX --  TROPONINI --   BNP (  last 3 results) No results found for this basename: PROBNP:3 in the last 8760 hours   CBG: No results found for this basename: GLUCAP:5 in the last 168 hours  Recent Results (from the past 240 hour(s))  URINE CULTURE     Status: Normal   Collection Time   04/25/12  1:33 AM      Component Value Range Status Comment   Specimen Description URINE, CLEAN CATCH   Final    Special Requests NONE   Final    Culture  Setup Time 04/25/2012 01:57   Final    Colony Count NO GROWTH   Final    Culture NO GROWTH   Final    Report Status 04/26/2012 FINAL   Final   CLOSTRIDIUM DIFFICILE BY PCR     Status: Normal   Collection Time   04/25/12  5:56 PM      Component Value Range Status Comment   C difficile by pcr NEGATIVE  NEGATIVE Final     CULTURE, BLOOD (ROUTINE X 2)     Status: Normal (Preliminary result)   Collection Time   04/26/12 10:53 AM      Component Value Range Status Comment   Specimen Description BLOOD HAND RIGHT   Final    Special Requests BOTTLES DRAWN AEROBIC AND ANAEROBIC 10CC   Final    Culture  Setup Time 04/26/2012 17:40   Final    Culture     Final    Value:        BLOOD CULTURE RECEIVED NO GROWTH TO DATE CULTURE WILL BE HELD FOR 5 DAYS BEFORE ISSUING A FINAL NEGATIVE REPORT   Report Status PENDING   Incomplete   CULTURE, BLOOD (ROUTINE X 2)     Status: Normal (Preliminary result)   Collection Time   04/26/12 11:00 AM      Component Value Range Status Comment   Specimen Description BLOOD HAND LEFT   Final    Special Requests BOTTLES DRAWN AEROBIC ONLY 10CC   Final    Culture  Setup Time 04/26/2012 17:41   Final    Culture     Final    Value:        BLOOD CULTURE RECEIVED NO GROWTH TO DATE CULTURE WILL BE HELD FOR 5 DAYS BEFORE ISSUING A FINAL NEGATIVE REPORT   Report Status PENDING   Incomplete      Studies: Ct Abdomen Pelvis W Contrast  04/25/2012  *RADIOLOGY REPORT*  Clinical Data: Right lower quadrant pain.  Nausea vomiting.  High fever for 1 month.  Foreign travel 1 month ago.  History of cholecystectomy.  Hypertension.  Gastroesophageal reflux disease. Peptic ulcer disease.  CT ABDOMEN AND PELVIS WITH CONTRAST  Technique:  Multidetector CT imaging of the abdomen and pelvis was performed following the standard protocol during bolus administration of intravenous contrast.  Contrast: 80mL OMNIPAQUE IOHEXOL 300 MG/ML  SOLN  Comparison: Plain film 04/24/2012.  CT of 04/22/2012 from [facility].  Findings: Lung bases:  Minimal dependent subsegmental atelectasis. Heart size upper normal, without pericardial or pleural effusion.  Abdomen/pelvis:  Normal liver.  A splenule.  Normal stomach, pancreas.  Cholecystectomy with mildly prominent cystic duct remnant, unchanged.  No surrounding inflammation.   Image 23. Normal biliary tract, adrenal glands, kidneys.  No retroperitoneal or retrocrural adenopathy.  Normal colon.  There is persistent wall thickening involving the terminal and distal ileum, including on image 57.  This is mildly improved since the prior.  There are prominent nodes in  the adjacent ileocolic mesentery.  The appendix is normal, including on image 58.  The proximal small bowel is unremarkable, without pneumatosis or free intraperitoneal air.  No pelvic adenopathy.    Normal urinary bladder and prostate. There is new small volume pelvic fluid, including on image 66 in the left hemi pelvis and image 76 in the pelvic cul-de-sac.  No abscess.  Bones/Musculoskeletal:  No acute osseous abnormality.  Sacroiliac joints within normal limits; no evidence of sacroiliitis.  IMPRESSION:  1.  Slight improvement in distal and terminal ileitis.  This could be infectious or represent Crohn's disease. 2.  New small volume pelvic fluid.   Original Report Authenticated By: Consuello Bossier, M.D.     Scheduled Meds:   . ciprofloxacin  500 mg Oral BID  . docusate sodium  100 mg Oral BID  . enoxaparin (LOVENOX) injection  40 mg Subcutaneous Daily  . metroNIDAZOLE  500 mg Oral Q8H  . pantoprazole  40 mg Oral Q1200  . sodium chloride  3 mL Intravenous Q12H  . DISCONTD: ciprofloxacin  400 mg Intravenous BID  . DISCONTD: metronidazole  500 mg Intravenous Q8H  . DISCONTD: pantoprazole (PROTONIX) IV  40 mg Intravenous QHS   Continuous Infusions:   . 0.9 % NaCl with KCl 20 mEq / L 125 mL/hr at 04/27/12 0554  . DISCONTD: sodium chloride    . DISCONTD: sodium chloride 500 mL (04/26/12 1259)  . DISCONTD: sodium chloride 250 mL (04/26/12 1448)    Active Problems:  Ileitis, terminal  Hypertension  GERD (gastroesophageal reflux disease)    Time spent: 40 minutes   [place]  [facility] Pager [phone]. If 8PM-8AM, please contact night-coverage at [email or web address], password [facility] 04/27/2012,  2:00 PM  LOS: 3 days

## 2012-04-28 LAB — BASIC METABOLIC PANEL
CO2: 26 mEq/L (ref 19–32)
Chloride: 101 mEq/L (ref 96–112)
GFR calc Af Amer: >90 mL/min (ref >90)
Potassium: 4 mEq/L (ref 3.5–5.1)

## 2012-04-28 LAB — CBC
HCT: 40.6 % (ref 39.0–52.0)
Hemoglobin: 14.2 g/dL (ref 13.0–17.0)
RBC: 4.46 MIL/uL (ref 4.22–5.81)
WBC: 7.6 10*3/uL (ref 4.0–10.5)

## 2012-04-28 LAB — PHOSPHORUS: Phosphorus: 4.3 mg/dL (ref 2.3–4.6)

## 2012-04-28 MED ORDER — DOCUSATE SODIUM 100 MG PO CAPS: 100.0000 mg | ORAL_CAPSULE | Freq: Every day | ORAL | Status: DC

## 2012-04-28 MED ORDER — POLYETHYLENE GLYCOL 3350 17 G PO PACK
17.0000 g | PACK | Freq: Every day | ORAL | Status: DC
  Filled 2012-04-28: qty 1

## 2012-04-28 NOTE — Discharge Summary (Signed)
Physician Discharge Summary  Kurt Lawson MRN: [id number] DOB/AGE: 1985-07-08 26 y.o.  PCP: Sheila Oats, MD   Admit date: 04/24/2012 Discharge date: 04/28/2012  Discharge Diagnoses:    Ileitis, terminal  Hypertension  GERD (gastroesophageal reflux disease)     Medication List     As of 04/28/2012  9:13 AM    TAKE these medications         acetaminophen 325 MG tablet   Commonly known as: TYLENOL   Take 650 mg by mouth daily.      amLODipine 5 MG tablet   Commonly known as: NORVASC   Take 5 mg by mouth daily.      ciprofloxacin 500 MG tablet   Commonly known as: CIPRO   Take 1 tablet (500 mg total) by mouth 2 (two) times daily.      DEXILANT 60 MG capsule   Generic drug: dexlansoprazole   Take 60 mg by mouth daily.      HYDROcodone-acetaminophen 5-325 MG per tablet   Commonly known as: NORCO/VICODIN   Take 1-2 tablets by mouth every 4 (four) hours as needed.      metroNIDAZOLE 500 MG tablet   Commonly known as: FLAGYL   Take 1 tablet (500 mg total) by mouth 3 (three) times daily.        Discharge Condition: Stable   Disposition: Final discharge disposition not confirmed   Consults:  #1 gastroenterology consultation    Significant Diagnostic Studies: Ct Abdomen Pelvis W Contrast  04/25/2012  *RADIOLOGY REPORT*  Clinical Data: Right lower quadrant pain.  Nausea vomiting.  High fever for 1 month.  Foreign travel 1 month ago.  History of cholecystectomy.  Hypertension.  Gastroesophageal reflux disease. Peptic ulcer disease.  CT ABDOMEN AND PELVIS WITH CONTRAST  Technique:  Multidetector CT imaging of the abdomen and pelvis was performed following the standard protocol during bolus administration of intravenous contrast.  Contrast: 80mL OMNIPAQUE IOHEXOL 300 MG/ML  SOLN  Comparison: Plain film 04/24/2012.  CT of 04/22/2012 from [facility].  Findings: Lung bases:  Minimal dependent subsegmental atelectasis. Heart size upper normal, without  pericardial or pleural effusion.  Abdomen/pelvis:  Normal liver.  A splenule.  Normal stomach, pancreas.  Cholecystectomy with mildly prominent cystic duct remnant, unchanged.  No surrounding inflammation.  Image 23. Normal biliary tract, adrenal glands, kidneys.  No retroperitoneal or retrocrural adenopathy.  Normal colon.  There is persistent wall thickening involving the terminal and distal ileum, including on image 57.  This is mildly improved since the prior.  There are prominent nodes in the adjacent ileocolic mesentery.  The appendix is normal, including on image 58.  The proximal small bowel is unremarkable, without pneumatosis or free intraperitoneal air.  No pelvic adenopathy.    Normal urinary bladder and prostate. There is new small volume pelvic fluid, including on image 66 in the left hemi pelvis and image 76 in the pelvic cul-de-sac.  No abscess.  Bones/Musculoskeletal:  No acute osseous abnormality.  Sacroiliac joints within normal limits; no evidence of sacroiliitis.  IMPRESSION:  1.  Slight improvement in distal and terminal ileitis.  This could be infectious or represent Crohn's disease. 2.  New small volume pelvic fluid.   Original Report Authenticated By: Consuello Bossier, M.D.       Microbiology: Recent Results (from the past 240 hour(s))  URINE CULTURE     Status: Normal   Collection Time   04/25/12  1:33 AM      Component Value  Range Status Comment   Specimen Description URINE, CLEAN CATCH   Final    Special Requests NONE   Final    Culture  Setup Time 04/25/2012 01:57   Final    Colony Count NO GROWTH   Final    Culture NO GROWTH   Final    Report Status 04/26/2012 FINAL   Final   STOOL CULTURE     Status: Normal (Preliminary result)   Collection Time   04/25/12  5:56 PM      Component Value Range Status Comment   Specimen Description STOOL   Final    Special Requests NONE   Final    Culture NO SUSPICIOUS COLONIES, CONTINUING TO HOLD   Final    Report Status PENDING    Incomplete   CLOSTRIDIUM DIFFICILE BY PCR     Status: Normal   Collection Time   04/25/12  5:56 PM      Component Value Range Status Comment   C difficile by pcr NEGATIVE  NEGATIVE Final   CULTURE, BLOOD (ROUTINE X 2)     Status: Normal (Preliminary result)   Collection Time   04/26/12 10:53 AM      Component Value Range Status Comment   Specimen Description BLOOD HAND RIGHT   Final    Special Requests BOTTLES DRAWN AEROBIC AND ANAEROBIC 10CC   Final    Culture  Setup Time 04/26/2012 17:40   Final    Culture     Final    Value:        BLOOD CULTURE RECEIVED NO GROWTH TO DATE CULTURE WILL BE HELD FOR 5 DAYS BEFORE ISSUING A FINAL NEGATIVE REPORT   Report Status PENDING   Incomplete   CULTURE, BLOOD (ROUTINE X 2)     Status: Normal (Preliminary result)   Collection Time   04/26/12 11:00 AM      Component Value Range Status Comment   Specimen Description BLOOD HAND LEFT   Final    Special Requests BOTTLES DRAWN AEROBIC ONLY 10CC   Final    Culture  Setup Time 04/26/2012 17:41   Final    Culture     Final    Value:        BLOOD CULTURE RECEIVED NO GROWTH TO DATE CULTURE WILL BE HELD FOR 5 DAYS BEFORE ISSUING A FINAL NEGATIVE REPORT   Report Status PENDING   Incomplete      Labs: Results for orders placed during the hospital encounter of 04/24/12 (from the past 48 hour(s))  CULTURE, BLOOD (ROUTINE X 2)     Status: Normal (Preliminary result)   Collection Time   04/26/12 10:53 AM      Component Value Range Comment   Specimen Description BLOOD HAND RIGHT      Special Requests BOTTLES DRAWN AEROBIC AND ANAEROBIC 10CC      Culture  Setup Time 04/26/2012 17:40      Culture        Value:        BLOOD CULTURE RECEIVED NO GROWTH TO DATE CULTURE WILL BE HELD FOR 5 DAYS BEFORE ISSUING A FINAL NEGATIVE REPORT   Report Status PENDING     CULTURE, BLOOD (ROUTINE X 2)     Status: Normal (Preliminary result)   Collection Time   04/26/12 11:00 AM      Component Value Range Comment   Specimen  Description BLOOD HAND LEFT      Special Requests BOTTLES DRAWN AEROBIC ONLY 10CC  Culture  Setup Time 04/26/2012 17:41      Culture        Value:        BLOOD CULTURE RECEIVED NO GROWTH TO DATE CULTURE WILL BE HELD FOR 5 DAYS BEFORE ISSUING A FINAL NEGATIVE REPORT   Report Status PENDING     PHOSPHORUS     Status: Normal   Collection Time   04/27/12  6:20 AM      Component Value Range Comment   Phosphorus 2.8  2.3 - 4.6 mg/dL   BASIC METABOLIC PANEL     Status: Abnormal   Collection Time   04/27/12  6:20 AM      Component Value Range Comment   Sodium 137  135 - 145 mEq/L    Potassium 3.7  3.5 - 5.1 mEq/L    Chloride 105  96 - 112 mEq/L    CO2 27  19 - 32 mEq/L    Glucose, Bld 113 (*) 70 - 99 mg/dL    BUN 6  6 - 23 mg/dL    Creatinine, Ser 4.09  0.50 - 1.35 mg/dL    Calcium 8.8  8.4 - 81.1 mg/dL    GFR calc non Af Amer >90  >90 mL/min    GFR calc Af Amer >90  >90 mL/min   CBC     Status: Abnormal   Collection Time   04/27/12  6:20 AM      Component Value Range Comment   WBC 9.9  4.0 - 10.5 K/uL    RBC 4.20 (*) 4.22 - 5.81 MIL/uL    Hemoglobin 13.4  13.0 - 17.0 g/dL    HCT 91.4 (*) 78.2 - 52.0 %    MCV 92.4  78.0 - 100.0 fL    MCH 31.9  26.0 - 34.0 pg    MCHC 34.5  30.0 - 36.0 g/dL    RDW 95.6  21.3 - 08.6 %    Platelets 190  150 - 400 K/uL   MAGNESIUM     Status: Normal   Collection Time   04/27/12  6:20 AM      Component Value Range Comment   Magnesium 2.2  1.5 - 2.5 mg/dL   PHOSPHORUS     Status: Normal   Collection Time   04/28/12  4:40 AM      Component Value Range Comment   Phosphorus 4.3  2.3 - 4.6 mg/dL   BASIC METABOLIC PANEL     Status: Normal   Collection Time   04/28/12  4:40 AM      Component Value Range Comment   Sodium 138  135 - 145 mEq/L    Potassium 4.0  3.5 - 5.1 mEq/L    Chloride 101  96 - 112 mEq/L    CO2 26  19 - 32 mEq/L    Glucose, Bld 87  70 - 99 mg/dL    BUN 8  6 - 23 mg/dL    Creatinine, Ser 5.78  0.50 - 1.35 mg/dL    Calcium  9.4  8.4 - 10.5 mg/dL    GFR calc non Af Amer >90  >90 mL/min    GFR calc Af Amer >90  >90 mL/min   CBC     Status: Normal   Collection Time   04/28/12  4:40 AM      Component Value Range Comment   WBC 7.6  4.0 - 10.5 K/uL    RBC 4.46  4.22 - 5.81 MIL/uL  Hemoglobin 14.2  13.0 - 17.0 g/dL    HCT 16.1  09.6 - 04.5 %    MCV 91.0  78.0 - 100.0 fL    MCH 31.8  26.0 - 34.0 pg    MCHC 35.0  30.0 - 36.0 g/dL    RDW 40.9  81.1 - 91.4 %    Platelets 237  150 - 400 K/uL   MAGNESIUM     Status: Normal   Collection Time   04/28/12  4:40 AM      Component Value Range Comment   Magnesium 2.3  1.5 - 2.5 mg/dL      HPI :26 year old male admitted for TI inflammation. He started to have right sided pain this past Friday. It was progressive to the point that he required hospitalization. At home he had complaints of fever up to 101 and he presented to [facility]. Cipro and Flagyl were started but his fever did not resolve and he was subsequently sent to [facility] for further evaluation. A CT scan reveals inflammation in the TI and it may be secondary to IBD versus infectious etiologies. No prior history of abdominal pain and he denies any diarrhea. He was in [place] recently, but he was not ill during his trip  HOSPITAL COURSE:  #1 1. Abdominal pain, one episode of diarrhea, couple of episodes of emesis -improved generalized abdominal pain and persistent nausea, improved leukocytosis, ESR in 50s, CT highly suspicious for inflammatory bowel disease, started on treatment with Cipro Flagyl IV,  no diarrhea, C. difficile PCR was ordered but patient did not have any diarrhea. He consulted  Dr. Elnoria Howard GI who did colonoscopy yesterday that showed Ileitis.  biopsy results still pending at this time Changed antibiotics to by mouth now, advancing diet , Patient tolerating well. The patient will follow up with GI in 2 weeks and continue with his oral antibiotics for another 7 days  2. History of GERD  continue PPI  3. History of hypertension- monitor off medications.  4.  hypomagnesemia, repleted  5. Gram-positive cocci from [place],  repeat blood cultures have not grown anything so far, this was likely a contaminant  Discharge Exam:  Blood pressure 117/68, pulse 78, temperature 97.6 F (36.4 C), temperature source Oral, resp. rate 16, height 5\' 6"  (1.676 m), weight 80.513 kg (177 lb 8 oz), SpO2 98.00%.    Gen: NAD, Alert and Oriented  HEENT: [place]/AT, EOMI  Neck: Supple, no LAD  Lungs: CTA Bilaterally  CV: RRR without M/G/R  ABM: Soft, diffusely tender, but more tender in the RLQ, +BS  Ext: No C/C/E         Follow-up Information    Follow up with Primary care provider. Schedule an appointment as soon as possible for a visit in 1 week. (Posthospital followup)       Follow up with HUNG,PATRICK D, MD. Schedule an appointment as soon as possible for a visit in 1 week.   Contact information:   [address] [place] [address] [phone]          Signed: Richarda Overlie 04/28/2012, 9:13 AM

## 2012-04-28 NOTE — Progress Notes (Signed)
Subjective: Since I last evaluated the patient, he has had minimal RLQ pain. The nausea has resolved. He has  Early satiety even with small meals.  Objective: Vital signs in last 24 hours: Temp:  [97.6 F (36.4 C)-98.1 F (36.7 C)] 97.6 F (36.4 C) (10/27 0527) Pulse Rate:  [64-90] 78  (10/27 0843) Resp:  [16] 16  (10/27 0527) BP: (90-117)/(46-76) 117/68 mmHg (10/27 0843) SpO2:  [96 %-99 %] 98 % (10/27 0843) Last BM Date: 04/26/12  Intake/Output from previous day: 10/26 0701 - 10/27 0700 In: 840 [P.O.:840] Out: -  Intake/Output this shift:   General appearance: alert, cooperative, appears stated age and no distress Resp: clear to auscultation bilaterally Cardio: regular rate and rhythm, S1, S2 normal, no murmur, click, rub or gallop GI: soft, non-tender; bowel sounds normal; no masses,  no organomegaly Extremities: extremities normal, atraumatic, no cyanosis or edema  Lab Results:  Basename 04/28/12 0440 04/27/12 0620 04/26/12 0540  WBC 7.6 9.9 15.3*  HGB 14.2 13.4 12.8*  HCT 40.6 38.8* 37.4*  PLT 237 190 171   BMET  Basename 04/28/12 0440 04/27/12 0620 04/26/12 0540  NA 138 137 135  K 4.0 3.7 3.6  CL 101 105 101  CO2 26 27 25   GLUCOSE 87 113* 88  BUN 8 6 6   CREATININE 0.79 0.74 0.66  CALCIUM 9.4 8.8 8.6   Medications: I have reviewed the patient's current medications.  Assessment/Plan: 1) Terminal ileitis: biopsies pending after the colonoscopy; continue Cipro and Flagyl for at least 10 days . As discussed with Dr. Susie Cassette, OP follow up is recommended.  2) GERD: On Pantaprazole.   LOS: 4 days   Blessing Zaucha 04/28/2012, 10:25 AM

## 2012-04-29 ENCOUNTER — Encounter (HOSPITAL_COMMUNITY): Payer: Self-pay | Admitting: Gastroenterology

## 2012-04-29 LAB — STOOL CULTURE

## 2012-05-02 LAB — CULTURE, BLOOD (ROUTINE X 2): Culture: NO GROWTH

## 2013-08-14 IMAGING — CT CT ABD-PELV W/ CM
2 of 5 series · 14 of 32 positions shown, 19 images · IV contrast (water/omni  & 80ml omni 300)
Comparison: Plain film 04/24/2012.  CT of 04/22/2012 from [facility]..

CLINICAL DATA: Right lower quadrant pain.  Nausea vomiting.  High
fever for 1 month.  Foreign travel 1 month ago.  History of
cholecystectomy.  Hypertension.  Gastroesophageal reflux disease.
Peptic ulcer disease.

CT ABDOMEN AND PELVIS WITH CONTRAST
TECHNIQUE: Multidetector CT imaging of the abdomen and pelvis was
performed following the standard protocol during bolus
administration of intravenous contrast.
Contrast: 80mL OMNIPAQUE IOHEXOL 300 MG/ML  SOLN

[Series 2: routine abdomen · axial · 0.79mm/px · z∈[-368,-68]mm · 6 of 84 slices shown, 11 images]
[im 12/84  soft-tissue]
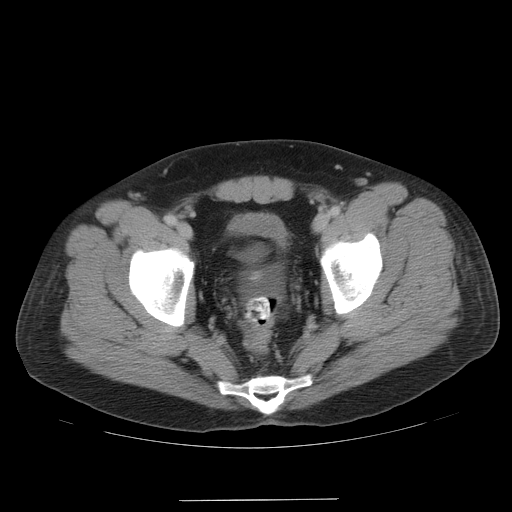
[im 12/84  bone]
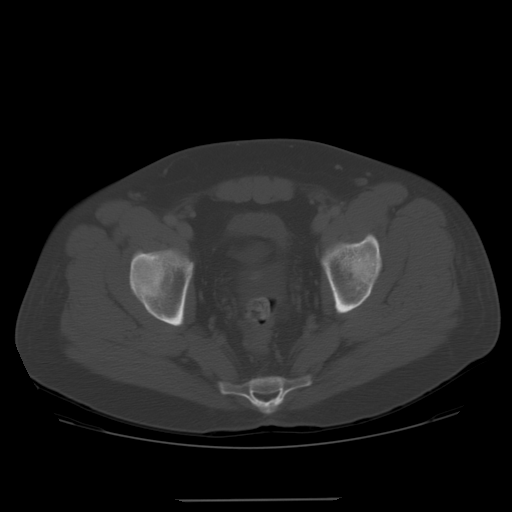
[im 24/84  soft-tissue]
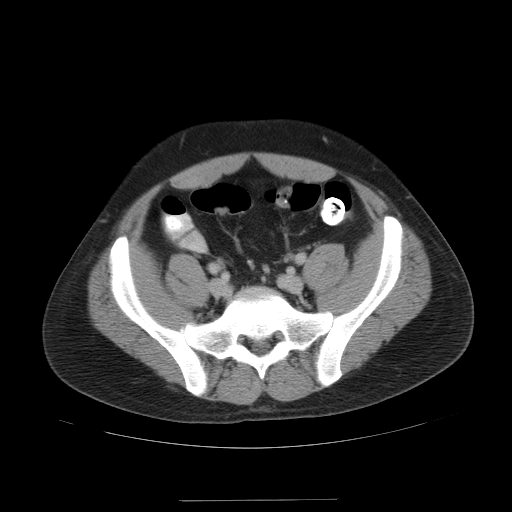
[im 36/84  soft-tissue]
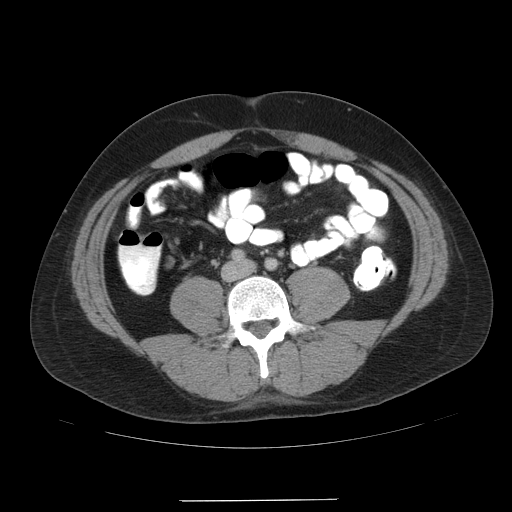
[im 36/84  lung]
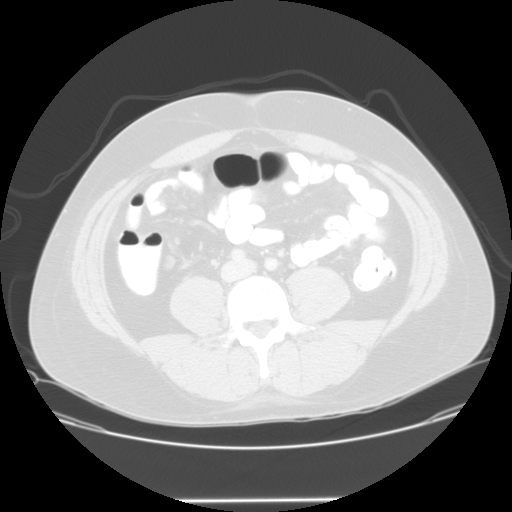
[im 48/84  soft-tissue]
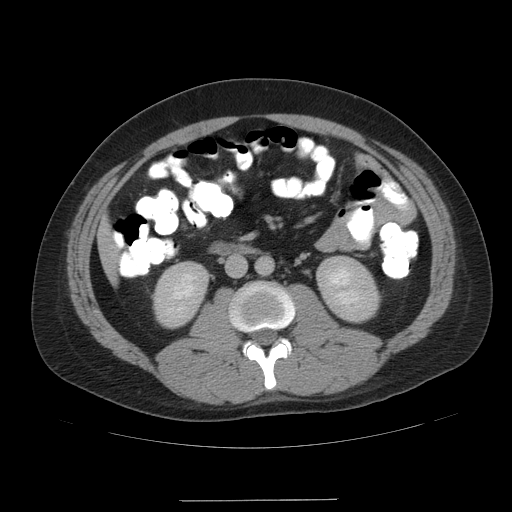
[im 48/84  lung]
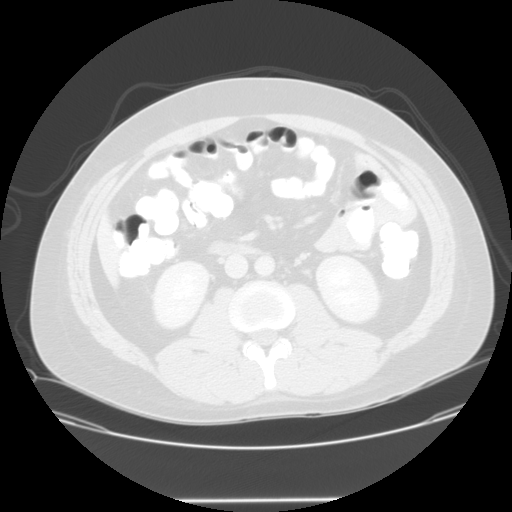
[im 60/84  soft-tissue]
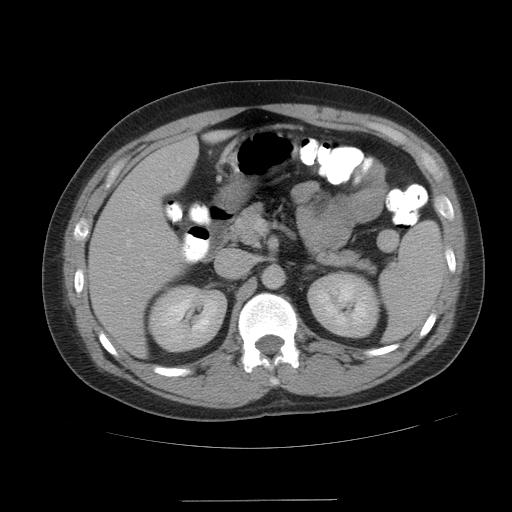
[im 60/84  lung]
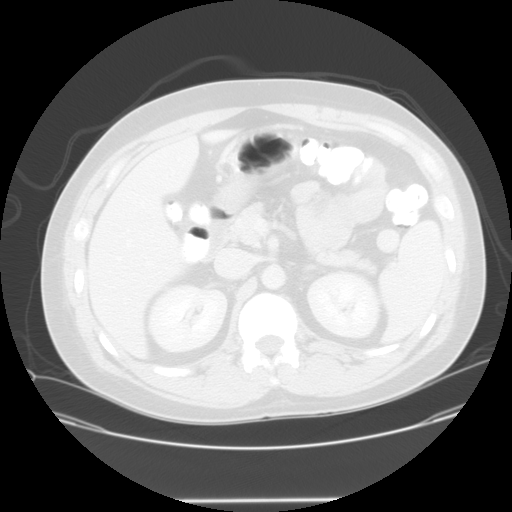
[im 72/84  soft-tissue]
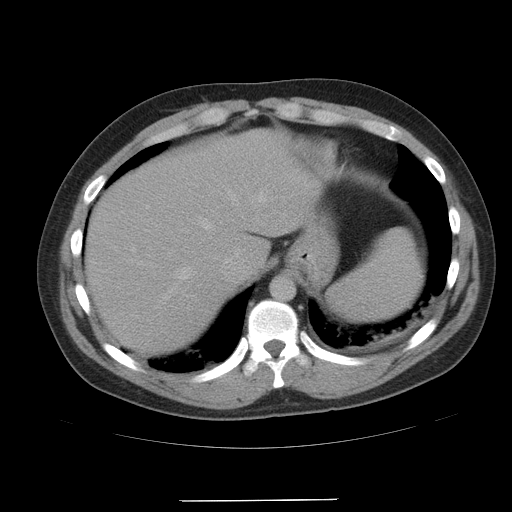
[im 72/84  lung]
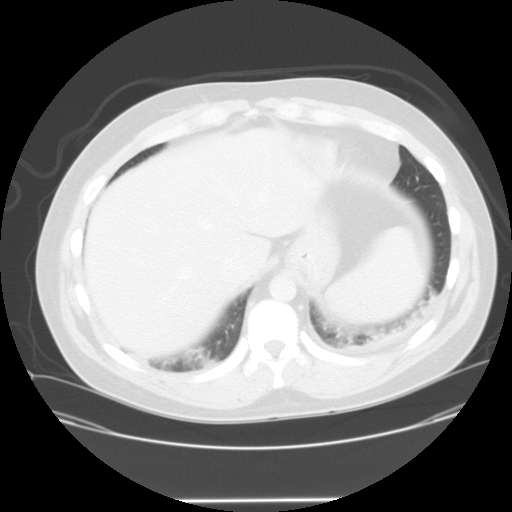

[Series 401: sagittals · sagittal · 0.92mm/px · 8 of 114 slices shown]
[im 12/114  soft-tissue]
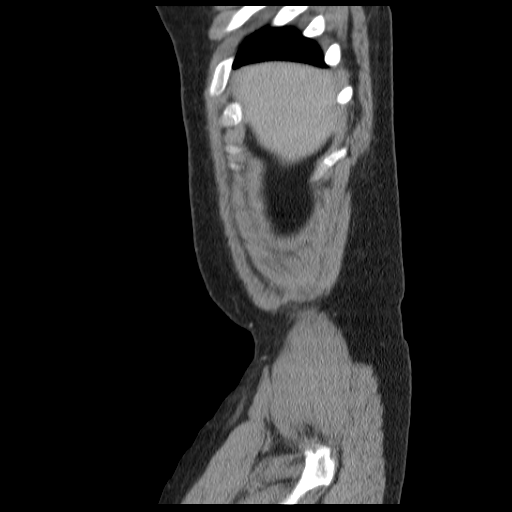
[im 23/114  soft-tissue]
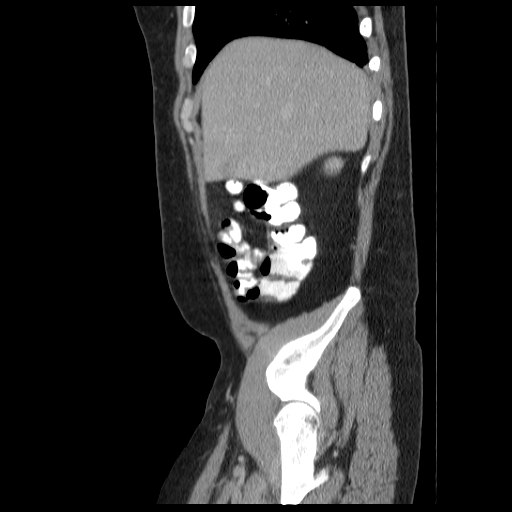
[im 34/114  soft-tissue]
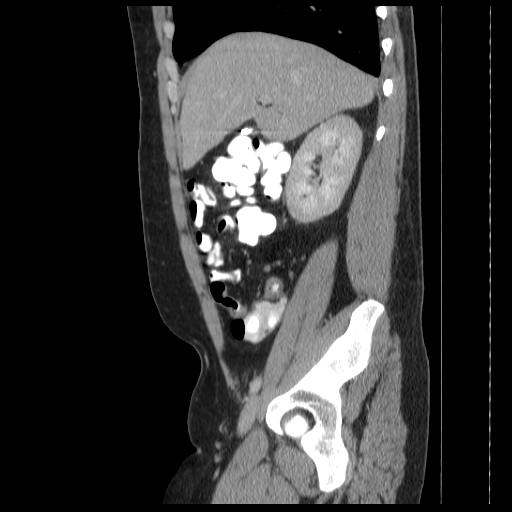
[im 46/114  soft-tissue]
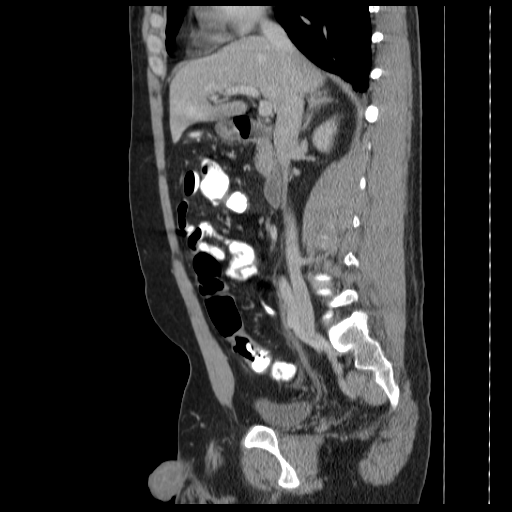
[im 68/114  soft-tissue]
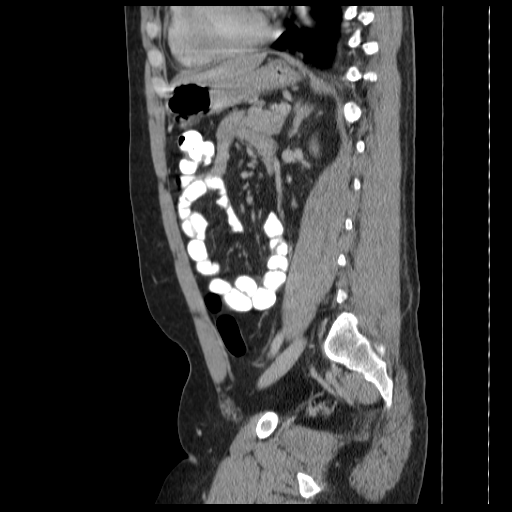
[im 80/114  soft-tissue]
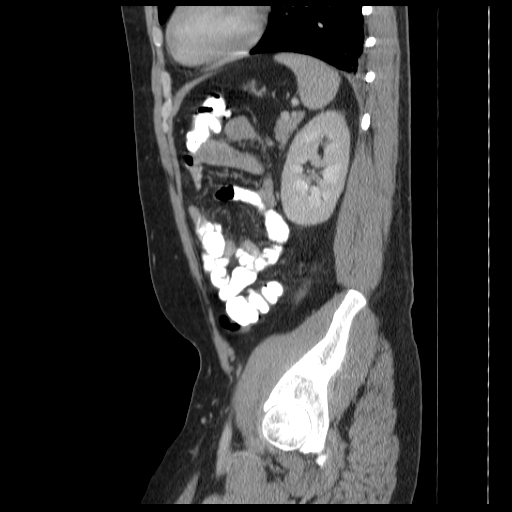
[im 91/114  soft-tissue]
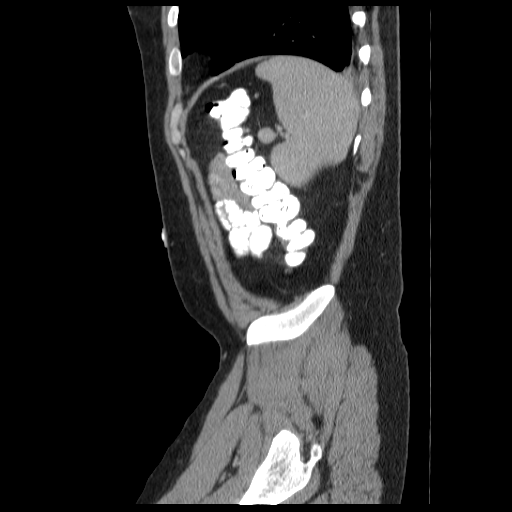
[im 102/114  soft-tissue]
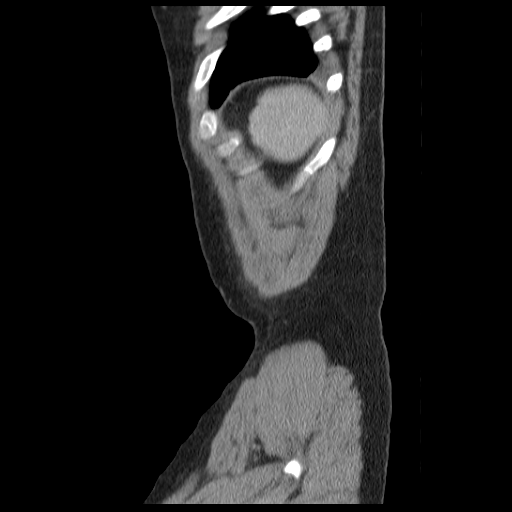

[14 of 32 positions shown; findings below may reference images not displayed]

FINDINGS: Lung bases:  Minimal dependent subsegmental atelectasis.
Heart size upper normal, without pericardial or pleural effusion.

Abdomen/pelvis:  Normal liver.  A splenule.  Normal stomach,
pancreas.  Cholecystectomy with mildly prominent cystic duct
remnant, unchanged.  No surrounding inflammation.  Image 23.
Normal biliary tract, adrenal glands, kidneys.

No retroperitoneal or retrocrural adenopathy.

Normal colon.  There is persistent wall thickening involving the
terminal and distal ileum, including on image 57.  This is mildly
improved since the prior.  There are prominent nodes in the
adjacent ileocolic mesentery.

The appendix is normal, including on image 58.

The proximal small bowel is unremarkable, without pneumatosis or
free intraperitoneal air.

No pelvic adenopathy.    Normal urinary bladder and prostate.
There is new small volume pelvic fluid, including on image 66 in
the left hemi pelvis and image 76 in the pelvic cul-de-sac.  No
abscess.

Bones/Musculoskeletal:  No acute osseous abnormality.  Sacroiliac
joints within normal limits; no evidence of sacroiliitis.
IMPRESSION: 1.  Slight improvement in distal and terminal ileitis.  This could
be infectious or represent Crohn's disease.
2.  New small volume pelvic fluid.

## 2013-12-24 ENCOUNTER — Other Ambulatory Visit: Payer: Self-pay | Admitting: Neurology

## 2013-12-24 ENCOUNTER — Ambulatory Visit (INDEPENDENT_AMBULATORY_CARE_PROVIDER_SITE_OTHER): Payer: 59 | Admitting: Neurology

## 2013-12-24 ENCOUNTER — Encounter: Payer: Self-pay | Admitting: Neurology

## 2013-12-24 VITALS — BP 136/74 | HR 71 | Ht 66.0 in | Wt 151.2 lb

## 2013-12-24 DIAGNOSIS — R202 Paresthesia of skin: Secondary | ICD-10-CM

## 2013-12-24 DIAGNOSIS — R32 Unspecified urinary incontinence: Secondary | ICD-10-CM | POA: Insufficient documentation

## 2013-12-24 DIAGNOSIS — R1310 Dysphagia, unspecified: Secondary | ICD-10-CM

## 2013-12-24 DIAGNOSIS — R209 Unspecified disturbances of skin sensation: Secondary | ICD-10-CM

## 2013-12-24 DIAGNOSIS — R200 Anesthesia of skin: Secondary | ICD-10-CM

## 2013-12-24 LAB — COMPREHENSIVE METABOLIC PANEL
ALK PHOS: 51 U/L (ref 39–117)
ALT: 15 U/L (ref 0–53)
AST: 18 U/L (ref 0–37)
Albumin: 4.5 g/dL (ref 3.5–5.2)
BUN: 11 mg/dL (ref 6–23)
CO2: 31 mEq/L (ref 19–32)
CREATININE: 0.86 mg/dL (ref 0.50–1.35)
Calcium: 10.1 mg/dL (ref 8.4–10.5)
Chloride: 101 mEq/L (ref 96–112)
GLUCOSE: 79 mg/dL (ref 70–99)
Potassium: 4.4 mEq/L (ref 3.5–5.3)
Sodium: 141 mEq/L (ref 135–145)
Total Bilirubin: 0.8 mg/dL (ref 0.2–1.2)
Total Protein: 7 g/dL (ref 6.0–8.3)

## 2013-12-24 LAB — CBC
HCT: 45.1 % (ref 39.0–52.0)
HEMOGLOBIN: 15.5 g/dL (ref 13.0–17.0)
MCH: 32.6 pg (ref 26.0–34.0)
MCHC: 34.4 g/dL (ref 30.0–36.0)
MCV: 94.9 fL (ref 78.0–100.0)
PLATELETS: 193 10*3/uL (ref 150–400)
RBC: 4.75 MIL/uL (ref 4.22–5.81)
RDW: 12.2 % (ref 11.5–15.5)
WBC: 6.5 10*3/uL (ref 4.0–10.5)

## 2013-12-24 LAB — SEDIMENTATION RATE: Sed Rate: 1 mm/hr (ref 0–16)

## 2013-12-24 LAB — CK: CK TOTAL: 110 U/L (ref 7–232)

## 2013-12-24 LAB — C-REACTIVE PROTEIN

## 2013-12-24 NOTE — Patient Instructions (Signed)
1. MRI brain with and without contrast 2. MRI lumbar spine without contrast 3. Bloodwork for CBC, CMP, TSH, B12, CK, acetylcholine receptor Ab, Scleroderma Ab, RPR, ANA, ESR, CRP, SS-A, SS-B 4. Follow-up after tests

## 2013-12-24 NOTE — Progress Notes (Signed)
NEUROLOGY CONSULTATION NOTE  Kurt Lawson MRN: [id number] DOB: [birth year]  Referring provider: Dr. Jerene Bears Primary care provider: Dr. Jerene Bears  Reason for consult:  dysphagia  Dear Dr Woody Seller:  Thank you for your kind referral of Kurt Lawson for consultation of the above symptoms. Although his history is well known to you, please allow me to reiterate it for the purpose of our medical record. The patient was accompanied to the clinic by his mother who also provides collateral information. Records and images were personally reviewed where available.  HISTORY OF PRESENT ILLNESS: This is a pleasant 28 year old right-handed man with a history of Crohn's disease presenting with multiple neurological symptoms that started in January 2015.  He started noticing difficulty swallowing bread, then in March he suddenly started having difficulty swallowing soft food such as yoghurt or mashed potatoes.  He describes the sensation as getting stuck behind his adam's apple, and even after he is able to finally swallow, it feels like something remains in his throat.  Even sips of water can cause coughing and choking.  He has lost 45 lbs in a 10-month period.  He had an MBS last month with note of mild oropharyngeal dysphagia with premature spillage of bolus to the level of the valleculae and transient delay in swallow initiation with solids, possibly due to decreased pharyngeal sensation.  Neurologic evaluation was recommended.  He has also noticed intermittent horizontal diplopia occurring at least every other day, lasting 10-15 minutes, not clearly related to fatigue, they mostly occur at 1 or 2pm, but can also happen in the morning or evening.  His mother has noticed that one of his eyes does not seem to open as much as the other at times. Since March 2015, he has also been noticing intermittent numbness and tingling in his legs and arms, from the tips of his toes to his groin, and tips of fingers up  the shoulders.  His right arm would suddenly go to sleep and he would drop something in his hand or trip because he cannot pick his legs up.  Symptoms last for a few minutes.  He has been having new symptoms of urinary incontinence where twice he had it while awake, and 3 times he woke up with incontinence.  The other day he had bowel incontinence, which is new.  He has a history of Crohn's disease and denies any flares or prior incontinence with this.  He denies any perineal numbness. No family history of similar symptoms.  He has a history of migraines usually with a throbbing sensation over the vertex with associated photosensitivity and relief with Relpax.  However since March, he has had a different type of headache that he feels in the middle of the inside of his head, with a stabbing throbbing pain like his head would explode.  Relpax has not been helpful for these headaches that occur at least twice a week, lasting 30 minutes up to 10 hours. He is always nauseated but this worsens with the headaches. Since January, he has been having night sweats and heat/cold intolerance. Family and co-workers noticed tremors in both hands. At night, his whole body jumps when he first falls asleep, making him anxious and causing insomnia. Other times he would sleep for prolonged periods.  He has been having difficulties with concentration since April, needing to focus more at work. He loses his thought or cannot think of a word. One time he went to [facility] and could  not recall why he went there.  He feels his depth perception, particularly when looking up, is off.  He has had personality changes as well, he is usually very easy going, but recently has been very irritable and easily frustrated.  He used to be very active but now is always fatigued with generalized body aches.  He had been on Remicade for the Crohn's 1-1/2 years ago.  He was then switched to steroids which apparently caused elevated LFTs, then to  Azathiopine which made him constantly sick to his stomach. This was stopped in May 2014. He was diagnosed with hypertension in 2013.   Laboratory Data: None available for review  PAST MEDICAL HISTORY: Past Medical History  Diagnosis Date  . GERD (gastroesophageal reflux disease)   . PUD (peptic ulcer disease)   . HTN (hypertension)   . Aortic valve cusp abnormality   . Aortic insufficiency     PAST SURGICAL HISTORY: Past Surgical History  Procedure Laterality Date  . Cholecystectomy  2012  . Colonoscopy  04/26/2012    Procedure: COLONOSCOPY;  Surgeon: Beryle Beams, MD;  Location: [facility] ENDOSCOPY;  Service: Endoscopy;  Laterality: N/A;    MEDICATIONS: Current Outpatient Prescriptions on File Prior to Visit  Medication Sig Dispense Refill  . acetaminophen (TYLENOL) 325 MG tablet Take 650 mg by mouth daily.      Marland Kitchen amLODipine (NORVASC) 5 MG tablet Take 5 mg by mouth daily.      Marland Kitchen dexlansoprazole (DEXILANT) 60 MG capsule Take 60 mg by mouth daily.       No current facility-administered medications on file prior to visit.    ALLERGIES: Allergies  Allergen Reactions  . Shellfish Allergy Anaphylaxis and Hives  . Aspirin Other (See Comments)    Stomach bleeding  . Erythromycin Hives    FAMILY HISTORY: Family History  Problem Relation Age of Onset  . Hypertension Father   . Hypertension Mother   . Diabetes Mother     SOCIAL HISTORY: History   Social History  . Marital Status: Single    Spouse Name: N/A    Number of Children: N/A  . Years of Education: N/A   Occupational History  . Not on file.   Social History Main Topics  . Smoking status: Former Research scientist (life sciences)  . Smokeless tobacco: Not on file  . Alcohol Use: Yes  . Drug Use: No  . Sexual Activity: Not on file   Other Topics Concern  . Not on file   Social History Narrative  . No narrative on file    REVIEW OF SYSTEMS: Constitutional: No fevers, chills, or sweats, no generalized fatigue, change in  appetite Eyes: as above Ear, nose and throat: No hearing loss, ear pain, nasal congestion, sore throat Cardiovascular: No chest pain, palpitations Respiratory:  No shortness of breath at rest or with exertion, wheezes GastrointestinaI: as above Genitourinary:  No dysuria, urinary retention or frequency Musculoskeletal:  No neck pain, back pain Integumentary: No rash, pruritus, skin lesions Neurological: as above Psychiatric: No depression, insomnia, anxiety Endocrine: No palpitations, +fatigue, diaphoresis, mood swings, change in appetite, change in weight, increased thirst Hematologic/Lymphatic:  No anemia, purpura, petechiae. Allergic/Immunologic: no itchy/runny eyes, nasal congestion, recent allergic reactions, rashes  PHYSICAL EXAM: Filed Vitals:   12/24/13 1440  BP: 136/74  Pulse: 71   General: No acute distress Head:  Normocephalic/atraumatic Eyes: Fundoscopic exam shows bilateral sharp discs, no vessel changes, exudates, or hemorrhages Neck: supple, no paraspinal tenderness, full range of motion Back: No paraspinal  tenderness Heart: regular rate and rhythm Lungs: Clear to auscultation bilaterally. Vascular: No carotid bruits. Skin/Extremities: No rash, no edema Neurological Exam: Mental status: alert and oriented to person, place, and time, no dysarthria or aphasia, Fund of knowledge is appropriate.  Recent and remote memory are intact.  Attention and concentration are normal.    Able to name objects and repeat phrases. Cranial nerves: CN I: not tested CN II: pupils equal, round and reactive to light, visual fields intact, fundi unremarkable. CN III, IV, VI:  full range of motion, no nystagmus, no ptosis CN V: facial sensation intact CN VII: upper and lower face symmetric CN VIII: hearing intact to finger rub CN IX, X: gag intact, uvula midline CN XI: sternocleidomastoid and trapezius muscles intact CN XII: tongue midline Bulk & Tone: normal, no  fasciculations. Motor: 5/5 throughout with no pronator drift. Sensation: decreased pin on right UE, decreased cold on right UE and LE.  Intact vibration and joint position sense.  Romberg test negative Deep Tendon Reflexes: +2 throughout, no ankle clonus Plantar responses: downgoing bilaterally Cerebellar: no incoordination on finger to nose testing Gait: narrow-based and steady, able to tandem walk adequately. Tremor: none today  IMPRESSION: This is a 28 year old right-handed man with a history of Crohn's disease and hypertension presenting for evaluation of dysphagia.  His MBS was abnormal with note of mild oropharyngeal dysphagia with premature spillage of bolus to the level of the valleculae and transient delay in swallow initiation with solids, possibly due to decreased pharyngeal sensation.  He also presents with several other neurological symptoms including bowel/bladder incontinence, headaches, horizontal diplopia, paresthesias in all extremities, tremors, and cognitive/personality changes.  The etiology of his symptoms is unclear. MRI brain with and without contrast will be ordered to assess for underlying structural abnormality.  For dysphagia workup, labs will be ordered including CBC, CMP, TSH, B12, CK, acetylcholine receptor Ab, Scleroderma Ab, RPR.  Check ANA, ESR, CRP, SS-A, SS-B.  MRI lumbar spine will be ordered to further evaluate for possible causes of bowel/bladder incontinence. If imaging studies are unrevealing, he will be scheduled for an EMG/NCV.  He will follow-up after the tests.  Thank you for allowing me to participate in the care of this patient. Please do not hesitate to call for any questions or concerns.   Ellouise Newer, M.D.  CC: Dr. Woody Seller

## 2013-12-25 LAB — VITAMIN B12: Vitamin B-12: 518 pg/mL (ref 211–911)

## 2013-12-25 LAB — ANTI-SCLERODERMA ANTIBODY: SCLERODERMA (SCL-70) (ENA) ANTIBODY, IGG: NEGATIVE

## 2013-12-25 LAB — RPR

## 2013-12-25 LAB — TSH: TSH: 1.41 u[IU]/mL (ref 0.350–4.500)

## 2013-12-25 LAB — SJOGREN'S SYNDROME ANTIBODS(SSA + SSB)
SSA (RO) (ENA) ANTIBODY, IGG: NEGATIVE
SSB (La) (ENA) Antibody, IgG: <1

## 2013-12-25 LAB — ANA: ANA: NEGATIVE

## 2013-12-26 ENCOUNTER — Telehealth: Payer: Self-pay | Admitting: Neurology

## 2013-12-26 NOTE — Telephone Encounter (Signed)
Pt mother states that pt may lose ins before the MRI and would like a order for a MRI to go somewhere else please call [phone] tina Lawley

## 2013-12-29 NOTE — Telephone Encounter (Signed)
Pt's mother called again this morning at 8:35AM to f/u on the message she left on 12/26/13  C/b [phone]

## 2013-12-29 NOTE — Telephone Encounter (Signed)
Called unable to reach will try again later

## 2013-12-30 NOTE — Telephone Encounter (Signed)
Orders fax to [facility]

## 2013-12-30 NOTE — Telephone Encounter (Signed)
Pt's spouse called wanting to confirm that the MRI order were sent in to [facility] this morning. She also wants to know if the lab work results were found.  C/b [phone]

## 2013-12-31 ENCOUNTER — Telehealth: Payer: Self-pay | Admitting: Neurology

## 2013-12-31 NOTE — Telephone Encounter (Signed)
Pt calling to see if lab results are available yet. Please call [phone] / Sherri S.

## 2014-01-01 NOTE — Telephone Encounter (Signed)
Have you reviewed put a paper copy on your desk

## 2014-01-01 NOTE — Telephone Encounter (Signed)
I have the MRI results, pls let him know the MRI of brain and lumbar spine are normal. I don't have a copy of the labs, not on EPIC. Thanks

## 2014-01-01 NOTE — Telephone Encounter (Signed)
Patient notified

## 2014-01-01 NOTE — Telephone Encounter (Signed)
Please call pt back sometime today with lab results. Pt is very anxious. CB# [phone] / Sherri S.

## 2014-01-03 LAB — MYASTHENIA GRAVIS PANEL 2
ACETYLCHOLINE REC MOD AB: 11 %
Acetylcholine Rec Binding: <0.3 nmol/L
Aceytlcholine Rec Bloc Ab: <15 % of inhibition (ref <15)

## 2014-01-06 ENCOUNTER — Other Ambulatory Visit: Payer: Self-pay | Admitting: *Deleted

## 2014-01-06 ENCOUNTER — Encounter: Payer: Self-pay | Admitting: *Deleted

## 2014-01-13 ENCOUNTER — Ambulatory Visit (HOSPITAL_COMMUNITY): Payer: 59

## 2014-01-14 ENCOUNTER — Encounter: Payer: Self-pay | Admitting: Neurology

## 2018-04-15 ENCOUNTER — Encounter: Payer: Self-pay | Admitting: Gastroenterology

## 2018-05-15 ENCOUNTER — Ambulatory Visit: Payer: Self-pay | Admitting: Gastroenterology

## 2018-06-18 ENCOUNTER — Ambulatory Visit: Payer: Self-pay | Admitting: Gastroenterology

## 2018-06-18 NOTE — Progress Notes (Deleted)
[facility] Consult Note:  History: Kurt Lawson 06/18/2018  Referring physician: Ignatius Specking, MD  Reason for consult/chief complaint: No chief complaint on file.   Subjective  HPI:  ***  He was admitted to our hospital in October 2013 with right lower quadrant abdominal pain and fever.  CT scan showed terminal ileal wall thickening with adjacent adenopathy.  Colonoscopy by Dr. Jeani Hawking had a poor preparation, but the TI was deeply intubated, and a single small, superficial ulcer was seen.  Biopsies showed chronic active ileitis. ROS:  Review of Systems   Past Medical History: Past Medical History:  Diagnosis Date  . Aortic insufficiency   . Aortic valve cusp abnormality   . Bile reflux gastritis   . Crohn disease (HCC)   . GERD (gastroesophageal reflux disease)   . Hiatal hernia   . HTN (hypertension)   . Insomnia   . Migraines   . PUD (peptic ulcer disease)   . Urinary incontinence   . Urinary urgency      Past Surgical History: Past Surgical History:  Procedure Laterality Date  . CHOLECYSTECTOMY  2012  . COLONOSCOPY  04/26/2012   Procedure: COLONOSCOPY;  Surgeon: Theda Belfast, MD;  Location: [facility] ENDOSCOPY;  Service: Endoscopy;  Laterality: N/A;  . COLONOSCOPY    . ESOPHAGOGASTRODUODENOSCOPY       Family History: Family History  Problem Relation Age of Onset  . Hypertension Father   . Hypertension Mother   . Diabetes Mother     Social History: Social History   Socioeconomic History  . Marital status: Single    Spouse name: Not on file  . Number of children: Not on file  . Years of education: Not on file  . Highest education level: Not on file  Occupational History  . Not on file  Social Needs  . Financial resource strain: Not on file  . Food insecurity:    Worry: Not on file    Inability: Not on file  . Transportation needs:    Medical: Not on file    Non-medical: Not on file  Tobacco Use  . Smoking  status: Former Smoker    Types: Cigarettes  . Smokeless tobacco: Never Used  Substance and Sexual Activity  . Alcohol use: Yes    Comment: occ  . Drug use: No  . Sexual activity: Not on file  Lifestyle  . Physical activity:    Days per week: Not on file    Minutes per session: Not on file  . Stress: Not on file  Relationships  . Social connections:    Talks on phone: Not on file    Gets together: Not on file    Attends religious service: Not on file    Active member of club or organization: Not on file    Attends meetings of clubs or organizations: Not on file    Relationship status: Not on file  Other Topics Concern  . Not on file  Social History Narrative  . Not on file    Allergies: Allergies  Allergen Reactions  . Shellfish Allergy Anaphylaxis and Hives  . Aspirin Other (See Comments)    Stomach bleeding  . Erythromycin Hives  . Penicillins     Outpatient Meds: Current Outpatient Medications  Medication Sig Dispense Refill  . acetaminophen (TYLENOL) 325 MG tablet Take 650 mg by mouth daily.    Marland Kitchen amLODipine (NORVASC) 5 MG tablet Take 5 mg by mouth daily.    Marland Kitchen  dexlansoprazole (DEXILANT) 60 MG capsule Take 60 mg by mouth daily.    Marland Kitchen eletriptan (RELPAX) 40 MG tablet Take 40 mg by mouth as needed for migraine or headache. One tablet by mouth at onset of headache. May repeat in 2 hours if headache persists or recurs.    . pantoprazole (PROTONIX) 40 MG tablet Take 40 mg by mouth daily.     No current facility-administered medications for this visit.       ___________________________________________________________________ Objective   Exam:  There were no vitals taken for this visit.   General: this is a(n) ***   Eyes: sclera anicteric, no redness  ENT: oral mucosa moist without lesions, no cervical or supraclavicular lymphadenopathy, *** dentition  CV: RRR without murmur, S1/S2, no JVD, no peripheral edema  Resp: clear to auscultation bilaterally,  normal RR and effort noted  GI: soft, *** tenderness, with active bowel sounds. No guarding or palpable organomegaly noted.  Skin; warm and dry, no rash or jaundice noted  Neuro: awake, alert and oriented x 3. Normal gross motor function and fluent speech  Labs:  ***  Radiologic Studies:  ***  Assessment: No diagnosis found.  ***  Plan:  ***  Thank you for the courtesy of this consult.  Please call me with any questions or concerns.  Charlie Pitter III  CC: Referring provider noted above

## 2020-03-03 ENCOUNTER — Inpatient Hospital Stay (HOSPITAL_COMMUNITY)
Admission: EM | Admit: 2020-03-03 | Discharge: 2020-03-05 | DRG: 177 | Disposition: A | Discharge status: Home / Self Care | Payer: Managed Care, Other (non HMO) | Attending: Family Medicine | Admitting: Family Medicine

## 2020-03-03 ENCOUNTER — Encounter (HOSPITAL_COMMUNITY): Payer: Self-pay

## 2020-03-03 ENCOUNTER — Other Ambulatory Visit: Payer: Self-pay

## 2020-03-03 ENCOUNTER — Emergency Department (HOSPITAL_COMMUNITY): Payer: Managed Care, Other (non HMO)

## 2020-03-03 DIAGNOSIS — Z886 Allergy status to analgesic agent status: Secondary | ICD-10-CM | POA: Diagnosis not present

## 2020-03-03 DIAGNOSIS — R0600 Dyspnea, unspecified: Secondary | ICD-10-CM | POA: Diagnosis present

## 2020-03-03 DIAGNOSIS — Z8711 Personal history of peptic ulcer disease: Secondary | ICD-10-CM

## 2020-03-03 DIAGNOSIS — R32 Unspecified urinary incontinence: Secondary | ICD-10-CM

## 2020-03-03 DIAGNOSIS — Z79899 Other long term (current) drug therapy: Secondary | ICD-10-CM

## 2020-03-03 DIAGNOSIS — Z9049 Acquired absence of other specified parts of digestive tract: Secondary | ICD-10-CM

## 2020-03-03 DIAGNOSIS — Z833 Family history of diabetes mellitus: Secondary | ICD-10-CM

## 2020-03-03 DIAGNOSIS — U071 COVID-19: Principal | ICD-10-CM | POA: Diagnosis present

## 2020-03-03 DIAGNOSIS — I10 Essential (primary) hypertension: Secondary | ICD-10-CM | POA: Diagnosis present

## 2020-03-03 DIAGNOSIS — K219 Gastro-esophageal reflux disease without esophagitis: Secondary | ICD-10-CM | POA: Diagnosis present

## 2020-03-03 DIAGNOSIS — I351 Nonrheumatic aortic (valve) insufficiency: Secondary | ICD-10-CM | POA: Diagnosis present

## 2020-03-03 DIAGNOSIS — E876 Hypokalemia: Secondary | ICD-10-CM | POA: Diagnosis present

## 2020-03-03 DIAGNOSIS — Z882 Allergy status to sulfonamides status: Secondary | ICD-10-CM | POA: Diagnosis not present

## 2020-03-03 DIAGNOSIS — G43909 Migraine, unspecified, not intractable, without status migrainosus: Secondary | ICD-10-CM | POA: Diagnosis present

## 2020-03-03 DIAGNOSIS — Z880 Allergy status to penicillin: Secondary | ICD-10-CM | POA: Diagnosis not present

## 2020-03-03 DIAGNOSIS — G4700 Insomnia, unspecified: Secondary | ICD-10-CM | POA: Diagnosis present

## 2020-03-03 DIAGNOSIS — Z8249 Family history of ischemic heart disease and other diseases of the circulatory system: Secondary | ICD-10-CM | POA: Diagnosis not present

## 2020-03-03 DIAGNOSIS — J9601 Acute respiratory failure with hypoxia: Secondary | ICD-10-CM | POA: Diagnosis present

## 2020-03-03 DIAGNOSIS — J1282 Pneumonia due to coronavirus disease 2019: Secondary | ICD-10-CM | POA: Diagnosis present

## 2020-03-03 DIAGNOSIS — Z87891 Personal history of nicotine dependence: Secondary | ICD-10-CM | POA: Diagnosis not present

## 2020-03-03 DIAGNOSIS — K5090 Crohn's disease, unspecified, without complications: Secondary | ICD-10-CM | POA: Diagnosis present

## 2020-03-03 LAB — SARS CORONAVIRUS 2 BY RT PCR (HOSPITAL ORDER, PERFORMED IN ~~LOC~~ HOSPITAL LAB): SARS Coronavirus 2: POSITIVE — AB

## 2020-03-03 LAB — C-REACTIVE PROTEIN: CRP: 2.4 mg/dL — ABNORMAL HIGH (ref <1.0)

## 2020-03-03 LAB — CBC WITH DIFFERENTIAL/PLATELET
Abs Immature Granulocytes: 0.08 10*3/uL — ABNORMAL HIGH (ref 0.00–0.07)
Basophils Absolute: 0 10*3/uL (ref 0.0–0.1)
Basophils Relative: 0 %
Eosinophils Absolute: 0 10*3/uL (ref 0.0–0.5)
Eosinophils Relative: 0 %
HCT: 46.3 % (ref 39.0–52.0)
Hemoglobin: 15.5 g/dL (ref 13.0–17.0)
Immature Granulocytes: 1 %
Lymphocytes Relative: 20 %
Lymphs Abs: 1.4 10*3/uL (ref 0.7–4.0)
MCH: 31.8 pg (ref 26.0–34.0)
MCHC: 33.5 g/dL (ref 30.0–36.0)
MCV: 94.9 fL (ref 80.0–100.0)
Monocytes Absolute: 0.6 10*3/uL (ref 0.1–1.0)
Monocytes Relative: 8 %
Neutro Abs: 5 10*3/uL (ref 1.7–7.7)
Neutrophils Relative %: 71 %
Platelets: 124 10*3/uL — ABNORMAL LOW (ref 150–400)
RBC: 4.88 MIL/uL (ref 4.22–5.81)
RDW: 12.9 % (ref 11.5–15.5)
WBC: 7 10*3/uL (ref 4.0–10.5)
nRBC: 0 % (ref 0.0–0.2)

## 2020-03-03 LAB — D-DIMER, QUANTITATIVE: D-Dimer, Quant: 0.43 ug/mL-FEU (ref 0.00–0.50)

## 2020-03-03 LAB — COMPREHENSIVE METABOLIC PANEL
ALT: 72 U/L — ABNORMAL HIGH (ref 0–44)
AST: 63 U/L — ABNORMAL HIGH (ref 15–41)
Albumin: 3.7 g/dL (ref 3.5–5.0)
Alkaline Phosphatase: 37 U/L — ABNORMAL LOW (ref 38–126)
Anion gap: 12 (ref 5–15)
BUN: 10 mg/dL (ref 6–20)
CO2: 23 mmol/L (ref 22–32)
Calcium: 8.4 mg/dL — ABNORMAL LOW (ref 8.9–10.3)
Chloride: 102 mmol/L (ref 98–111)
Creatinine, Ser: 0.85 mg/dL (ref 0.61–1.24)
GFR calc Af Amer: >60 mL/min (ref >60)
GFR calc non Af Amer: >60 mL/min (ref >60)
Glucose, Bld: 92 mg/dL (ref 70–99)
Potassium: 3.2 mmol/L — ABNORMAL LOW (ref 3.5–5.1)
Sodium: 137 mmol/L (ref 135–145)
Total Bilirubin: 0.8 mg/dL (ref 0.3–1.2)
Total Protein: 7.3 g/dL (ref 6.5–8.1)

## 2020-03-03 LAB — TROPONIN I (HIGH SENSITIVITY)
Troponin I (High Sensitivity): 16 ng/L (ref <18)
Troponin I (High Sensitivity): 16 ng/L (ref <18)

## 2020-03-03 LAB — TRIGLYCERIDES: Triglycerides: 181 mg/dL — ABNORMAL HIGH (ref <150)

## 2020-03-03 LAB — PROCALCITONIN: Procalcitonin: <0.1 ng/mL

## 2020-03-03 LAB — FERRITIN: Ferritin: 1246 ng/mL — ABNORMAL HIGH (ref 24–336)

## 2020-03-03 LAB — LACTIC ACID, PLASMA: Lactic Acid, Venous: 1 mmol/L (ref 0.5–1.9)

## 2020-03-03 LAB — FIBRINOGEN: Fibrinogen: 377 mg/dL (ref 210–475)

## 2020-03-03 LAB — LACTATE DEHYDROGENASE: LDH: 321 U/L — ABNORMAL HIGH (ref 98–192)

## 2020-03-03 MED ORDER — IPRATROPIUM-ALBUTEROL 20-100 MCG/ACT IN AERS
1.0000 | INHALATION_SPRAY | Freq: Four times a day (QID) | RESPIRATORY_TRACT | Status: DC
  Administered 2020-03-04 – 2020-03-05 (×6): 1 via RESPIRATORY_TRACT

## 2020-03-03 MED ORDER — PANTOPRAZOLE SODIUM 40 MG PO TBEC
40.0000 mg | DELAYED_RELEASE_TABLET | Freq: Every day | ORAL | Status: DC
  Administered 2020-03-04 – 2020-03-05 (×2): 40 mg via ORAL
  Filled 2020-03-03 (×2): qty 1

## 2020-03-03 MED ORDER — GUAIFENESIN-DM 100-10 MG/5ML PO SYRP
10.0000 mL | ORAL_SOLUTION | ORAL | Status: DC | PRN
  Administered 2020-03-04 (×2): 10 mL via ORAL
  Filled 2020-03-03 (×2): qty 10

## 2020-03-03 MED ORDER — ZINC SULFATE 220 (50 ZN) MG PO CAPS
220.0000 mg | ORAL_CAPSULE | Freq: Every day | ORAL | Status: DC
  Administered 2020-03-04 – 2020-03-05 (×2): 220 mg via ORAL
  Filled 2020-03-03 (×2): qty 1

## 2020-03-03 MED ORDER — TRAZODONE HCL 50 MG PO TABS
50.0000 mg | ORAL_TABLET | Freq: Every evening | ORAL | Status: DC | PRN
  Filled 2020-03-03: qty 1

## 2020-03-03 MED ORDER — POTASSIUM CHLORIDE CRYS ER 20 MEQ PO TBCR
60.0000 meq | EXTENDED_RELEASE_TABLET | Freq: Once | ORAL | Status: AC
  Administered 2020-03-03: 60 meq via ORAL
  Filled 2020-03-03: qty 3

## 2020-03-03 MED ORDER — SODIUM CHLORIDE 0.9 % IV SOLN
100.0000 mg | Freq: Every day | INTRAVENOUS | Status: DC
  Administered 2020-03-04 – 2020-03-05 (×2): 100 mg via INTRAVENOUS
  Filled 2020-03-03 (×2): qty 20

## 2020-03-03 MED ORDER — ACETAMINOPHEN 325 MG PO TABS
650.0000 mg | ORAL_TABLET | Freq: Four times a day (QID) | ORAL | Status: DC | PRN
  Administered 2020-03-04 – 2020-03-05 (×2): 650 mg via ORAL
  Filled 2020-03-03 (×2): qty 2

## 2020-03-03 MED ORDER — POLYETHYLENE GLYCOL 3350 17 G PO PACK: 17.0000 g | PACK | Freq: Every day | ORAL | Status: DC | PRN

## 2020-03-03 MED ORDER — ONDANSETRON HCL 4 MG/2ML IJ SOLN: 4.0000 mg | Freq: Four times a day (QID) | INTRAMUSCULAR | Status: DC | PRN

## 2020-03-03 MED ORDER — SODIUM CHLORIDE 0.9 % IV SOLN: INTRAVENOUS | Status: DC

## 2020-03-03 MED ORDER — SODIUM CHLORIDE 0.9 % IV SOLN
100.0000 mg | INTRAVENOUS | Status: AC
  Administered 2020-03-03 (×2): 100 mg via INTRAVENOUS
  Filled 2020-03-03 (×2): qty 20

## 2020-03-03 MED ORDER — ENOXAPARIN SODIUM 40 MG/0.4ML ~~LOC~~ SOLN
40.0000 mg | SUBCUTANEOUS | Status: DC
  Administered 2020-03-04 – 2020-03-05 (×2): 40 mg via SUBCUTANEOUS
  Filled 2020-03-03 (×2): qty 0.4

## 2020-03-03 MED ORDER — ASCORBIC ACID 500 MG PO TABS
500.0000 mg | ORAL_TABLET | Freq: Every day | ORAL | Status: DC
  Administered 2020-03-04 – 2020-03-05 (×2): 500 mg via ORAL
  Filled 2020-03-03 (×2): qty 1

## 2020-03-03 MED ORDER — METHYLPREDNISOLONE SODIUM SUCC 125 MG IJ SOLR
0.5000 mg/kg | Freq: Two times a day (BID) | INTRAMUSCULAR | Status: DC
  Administered 2020-03-04 – 2020-03-05 (×3): 48.75 mg via INTRAVENOUS
  Filled 2020-03-03 (×3): qty 2

## 2020-03-03 MED ORDER — ONDANSETRON HCL 4 MG/2ML IJ SOLN
4.0000 mg | Freq: Once | INTRAMUSCULAR | Status: AC
  Administered 2020-03-03: 4 mg via INTRAVENOUS
  Filled 2020-03-03: qty 2

## 2020-03-03 MED ORDER — DEXAMETHASONE SODIUM PHOSPHATE 10 MG/ML IJ SOLN
10.0000 mg | Freq: Once | INTRAMUSCULAR | Status: AC
  Administered 2020-03-03: 10 mg via INTRAVENOUS
  Filled 2020-03-03: qty 1

## 2020-03-03 MED ORDER — ALBUTEROL SULFATE HFA 108 (90 BASE) MCG/ACT IN AERS
INHALATION_SPRAY | RESPIRATORY_TRACT | Status: AC
  Administered 2020-03-04: 4
  Filled 2020-03-03: qty 6.7

## 2020-03-03 MED ORDER — HYDROCOD POLST-CPM POLST ER 10-8 MG/5ML PO SUER: 5.0000 mL | Freq: Two times a day (BID) | ORAL | Status: DC | PRN

## 2020-03-03 MED ORDER — OXYCODONE HCL 5 MG PO TABS: 5.0000 mg | ORAL_TABLET | ORAL | Status: DC | PRN

## 2020-03-03 MED ORDER — ONDANSETRON HCL 4 MG PO TABS: 4.0000 mg | ORAL_TABLET | Freq: Four times a day (QID) | ORAL | Status: DC | PRN

## 2020-03-03 NOTE — ED Triage Notes (Signed)
Pt presents to ED with complaints of increased SOB since Sunday. Pt tested positive for Covid on Sunday.

## 2020-03-03 NOTE — ED Notes (Signed)
No covid vaccination   Pt reports feeling better   NAD

## 2020-03-03 NOTE — ED Notes (Signed)
Date and time results received: 03/03/20 1500    Test: COVID Critical Value: Positive  Name of Provider Notified: Dr. Laural Benes  Orders Received? Or Actions Taken?: No new orders given.

## 2020-03-03 NOTE — ED Provider Notes (Signed)
Sending  Emergency Department Provider Note   I have reviewed the triage vital signs and the nursing notes.   HISTORY  Chief Complaint Shortness of Breath   HPI Kurt Lawson is a 34 y.o. male with past medical history reviewed below presents to the emergency department for evaluation of shortness of breath worsening over the past several days.  Patient tested positive for COVID-19 last week at a local pharmacy.  He is currently symptomatic on day 10.  He had mild to moderate flulike symptoms over most of the course of illness but has developed shortness of breath over the past several days.  He reports some strange feeling in his chest with deep breathing which he describes as a "cold breeze" with deep breathing but denies specific pain.  No radiation of symptoms or other modifying factors.   Past Medical History:  Diagnosis Date  . Aortic insufficiency   . Aortic valve cusp abnormality   . Bile reflux gastritis   . Crohn disease (HCC)   . GERD (gastroesophageal reflux disease)   . Hiatal hernia   . HTN (hypertension)   . Insomnia   . Migraines   . PUD (peptic ulcer disease)   . Urinary incontinence   . Urinary urgency     Patient Active Problem List   Diagnosis Date Noted  . Pneumonia due to COVID-19 virus 03/03/2020  . Acute respiratory failure with hypoxia (HCC) 03/03/2020  . Hypokalemia 03/03/2020  . Dyspnea 03/03/2020  . Migraines   . Crohn disease (HCC)   . Insomnia   . Dysphagia, unspecified(787.20) 12/24/2013  . Numbness and tingling 12/24/2013  . Urinary incontinence 12/24/2013  . Ileitis, terminal (HCC) 04/25/2012  . Hypertension 04/25/2012  . GERD (gastroesophageal reflux disease) 04/25/2012    Past Surgical History:  Procedure Laterality Date  . APPENDECTOMY    . CHOLECYSTECTOMY  2012  . COLONOSCOPY  04/26/2012   Procedure: COLONOSCOPY;  Surgeon: Theda Belfast, MD;  Location: [facility] ENDOSCOPY;  Service: Endoscopy;  Laterality: N/A;  . COLONOSCOPY     . ESOPHAGOGASTRODUODENOSCOPY      Allergies Shellfish allergy, Aspirin, Erythromycin, and Penicillins  Family History  Problem Relation Age of Onset  . Hypertension Father   . Hypertension Mother   . Diabetes Mother     Social History Social History   Tobacco Use  . Smoking status: Former Smoker    Types: Cigarettes  . Smokeless tobacco: Never Used  Substance Use Topics  . Alcohol use: Yes    Comment: occ  . Drug use: No    Review of Systems  Constitutional: Positive fever/chills and body aches.  Eyes: No visual changes. ENT: No sore throat. Cardiovascular: Denies chest pain. Respiratory: Positive shortness of breath and cough.  Gastrointestinal: No abdominal pain. Positive nausea, no vomiting.  No diarrhea.  No constipation. Genitourinary: Negative for dysuria. Musculoskeletal: Negative for back pain. Skin: Negative for rash. Neurological: Negative for focal weakness or numbness. Positive HA.   10-point ROS otherwise negative.  ____________________________________________   PHYSICAL EXAM:  VITAL SIGNS: ED Triage Vitals  Enc Vitals Group     BP 03/03/20 1108 (!) 142/94     Pulse Rate 03/03/20 1108 95     Resp 03/03/20 1108 18     Temp 03/03/20 1108 100.2 F (37.9 C)     Temp Source 03/03/20 1108 Oral     SpO2 03/03/20 1108 97 %     Weight 03/03/20 1107 215 lb (97.5 kg)  Height 03/03/20 1107 5\' 7"  (1.702 m)   Constitutional: Alert and oriented. Increased WOB with 2-3 word sentences.  Eyes: Conjunctivae are normal.  Head: Atraumatic. Nose: No congestion/rhinnorhea. Mouth/Throat: Mucous membranes are moist.  Neck: No stridor.   Cardiovascular: Normal rate, regular rhythm. Good peripheral circulation. Grossly normal heart sounds.   Respiratory: Normal respiratory effort.  No retractions. Lungs CTAB. Gastrointestinal: Soft and nontender. No distention.  Musculoskeletal: No lower extremity tenderness nor edema. No gross deformities of  extremities. Neurologic:  Normal speech and language. No gross focal neurologic deficits are appreciated.  Skin:  Skin is warm, dry and intact. No rash noted.  ____________________________________________   LABS (all labs ordered are listed, but only abnormal results are displayed)  Labs Reviewed  SARS CORONAVIRUS 2 BY RT PCR (HOSPITAL ORDER, PERFORMED IN [facility]) - Abnormal; Notable for the following components:      Result Value   SARS Coronavirus 2 POSITIVE (*)    All other components within normal limits  CBC WITH DIFFERENTIAL/PLATELET - Abnormal; Notable for the following components:   Platelets 124 (*)    Abs Immature Granulocytes 0.08 (*)    All other components within normal limits  COMPREHENSIVE METABOLIC PANEL - Abnormal; Notable for the following components:   Potassium 3.2 (*)    Calcium 8.4 (*)    AST 63 (*)    ALT 72 (*)    Alkaline Phosphatase 37 (*)    All other components within normal limits  LACTATE DEHYDROGENASE - Abnormal; Notable for the following components:   LDH 321 (*)    All other components within normal limits  FERRITIN - Abnormal; Notable for the following components:   Ferritin 1,246 (*)    All other components within normal limits  TRIGLYCERIDES - Abnormal; Notable for the following components:   Triglycerides 181 (*)    All other components within normal limits  C-REACTIVE PROTEIN - Abnormal; Notable for the following components:   CRP 2.4 (*)    All other components within normal limits  CULTURE, BLOOD (ROUTINE X 2)  CULTURE, BLOOD (ROUTINE X 2)  LACTIC ACID, PLASMA  D-DIMER, QUANTITATIVE (NOT AT [facility])  PROCALCITONIN  FIBRINOGEN  TROPONIN I (HIGH SENSITIVITY)  TROPONIN I (HIGH SENSITIVITY)   ____________________________________________  EKG   EKG Interpretation  Date/Time:  Wednesday March 03 2020 12:22:05 EDT Ventricular Rate:  50 PR Interval:    QRS Duration: 110 QT Interval:  447 QTC Calculation: 408 R  Axis:   26 Text Interpretation: Junctional rhythm Abnormal T, consider ischemia, diffuse leads T wave changes more pronounced Confirmed by 1972 [phone]) on 03/03/2020 12:32:55 PM       ____________________________________________  RADIOLOGY  DG Chest Portable 1 View  Result Date: 03/03/2020 CLINICAL DATA:  Cough, COVID positive with chest congestion EXAM: PORTABLE CHEST 1 VIEW COMPARISON:  September 07, 2003 FINDINGS: Trachea midline. Cardiomediastinal contours and hilar structures are normal. Subtle opacity a demonstrated in the RIGHT mid and lower chest. No effusion. No lobar level consolidation. On limited assessment skeletal structures without acute process. IMPRESSION: Subtle opacity in the RIGHT mid and lower chest, may be related to viral pneumonia given the patient's history. No lobar consolidation or dense opacification. Electronically Signed   By: September 09, 2003 M.D.   On: 03/03/2020 11:44    ____________________________________________   PROCEDURES  Procedure(s) performed:   Procedures  CRITICAL CARE Performed by: 05/03/2020 Total critical care time: 35 minutes Critical care time was exclusive of separately  billable procedures and treating other patients. Critical care was necessary to treat or prevent imminent or life-threatening deterioration. Critical care was time spent personally by me on the following activities: development of treatment plan with patient and/or surrogate as well as nursing, discussions with consultants, evaluation of patient's response to treatment, examination of patient, obtaining history from patient or surrogate, ordering and performing treatments and interventions, ordering and review of laboratory studies, ordering and review of radiographic studies, pulse oximetry and re-evaluation of patient's condition.  Alona Bene, MD Emergency Medicine  ____________________________________________   INITIAL IMPRESSION / ASSESSMENT AND PLAN / ED  COURSE  Pertinent labs & imaging results that were available during my care of the patient were reviewed by me and considered in my medical decision making (see chart for details).   Patient presents to the emergency department with increased shortness of breath in the setting of known Covid infection.  His work of breathing with borderline fever here.  On my initial exam he is satting 90% on room air with increased work of breathing.  He does have occasional desaturation to 86% on room air with good waveform.  Started on 2 L nasal cannula which improved symptoms.   Labs reviewed. CXR reviewed. Starting steroid and antiviral meds. Will admit.   Discussed patient's case with [facility] to request admission. Patient and family (if present) updated with plan. Care transferred to [facility] service.  I reviewed all nursing notes, vitals, pertinent old records, EKGs, labs, imaging (as available).  ____________________________________________  FINAL CLINICAL IMPRESSION(S) / ED DIAGNOSES  Final diagnoses:  Acute respiratory failure with hypoxia (HCC)  COVID-19     MEDICATIONS GIVEN DURING THIS VISIT:  Medications  remdesivir 100 mg in sodium chloride 0.9 % 100 mL IVPB (0 mg Intravenous Stopped 03/03/20 1428)    Followed by  remdesivir 100 mg in sodium chloride 0.9 % 100 mL IVPB (has no administration in time range)  dexamethasone (DECADRON) injection 10 mg (10 mg Intravenous Given 03/03/20 1228)  ondansetron (ZOFRAN) injection 4 mg (4 mg Intravenous Given 03/03/20 1227)  potassium chloride SA (KLOR-CON) CR tablet 60 mEq (60 mEq Oral Given 03/03/20 1644)     Note:  This document was prepared using Dragon voice recognition software and may include unintentional dictation errors.  Alona Bene, MD, [facility] Emergency Medicine    Teren Zurcher, Arlyss Repress, MD 03/03/20 2026

## 2020-03-03 NOTE — H&P (Signed)
History and Physical  [facility]  SYDNEY AZURE HUT:[id number] DOB: 1986/06/16 DOA: 03/03/2020  PCP: Ignatius Specking, MD  Patient coming from: Home   I have personally briefly reviewed patient's old medical records in [facility]  Chief Complaint: shortness of breath   HPI: GAGAN DILLION is a 34 y.o. male with medical history significant for GERD, HTN, migraines, PUD, aortic insufficiency, urinary incontinence, and surgical history noted below reports that for past several days to week he has been having upper respiratory symptoms, weakness, fatigue, malaise and 4 days ago he had a Covid test at a local pharmacy that came back positive.  Since that time he has had increasing shortness of breath and malaise.  He came to the ER due to increasing shortness of breath.  He was noted to be 86% on room air while resting in the ED. His Sars 2 coronavirus test was positive.  His chest xray was positive for viral appearing infiltrates consistent with Covid infection.  He was noted to be tachypneic.  He was placed on 2 L  with some improvement in symptoms.  He was started on IV steroids, IV remdesivir and admission was requested for further management of Covid pneumonia.      Review of Systems: As per HPI otherwise 10 point review of systems negative.   Past Medical History:  Diagnosis Date  . Aortic insufficiency   . Aortic valve cusp abnormality   . Bile reflux gastritis   . Crohn disease (HCC)   . GERD (gastroesophageal reflux disease)   . Hiatal hernia   . HTN (hypertension)   . Insomnia   . Migraines   . PUD (peptic ulcer disease)   . Urinary incontinence   . Urinary urgency     Past Surgical History:  Procedure Laterality Date  . APPENDECTOMY    . CHOLECYSTECTOMY  2012  . COLONOSCOPY  04/26/2012   Procedure: COLONOSCOPY;  Surgeon: Theda Belfast, MD;  Location: [facility] ENDOSCOPY;  Service: Endoscopy;  Laterality: N/A;  . COLONOSCOPY    . ESOPHAGOGASTRODUODENOSCOPY        reports that he has quit smoking. His smoking use included cigarettes. He has never used smokeless tobacco. He reports current alcohol use. He reports that he does not use drugs.  Allergies  Allergen Reactions  . Shellfish Allergy Anaphylaxis and Hives  . Aspirin Other (See Comments)    Stomach bleeding  . Erythromycin Hives  . Penicillins     Family History  Problem Relation Age of Onset  . Hypertension Father   . Hypertension Mother   . Diabetes Mother      Prior to Admission medications   Medication Sig Start Date End Date Taking? Authorizing Provider  acetaminophen (TYLENOL) 325 MG tablet Take 650 mg by mouth daily.   Yes [provider]  dexlansoprazole (DEXILANT) 60 MG capsule Take 60 mg by mouth daily.   Yes [provider]  amLODipine (NORVASC) 5 MG tablet Take 5 mg by mouth daily. Patient not taking: Reported on 03/03/2020    [provider]  eletriptan (RELPAX) 40 MG tablet Take 40 mg by mouth as needed for migraine or headache. One tablet by mouth at onset of headache. May repeat in 2 hours if headache persists or recurs. Patient not taking: Reported on 03/03/2020    [provider]  pantoprazole (PROTONIX) 40 MG tablet Take 40 mg by mouth daily. Patient not taking: Reported on 03/03/2020    [provider]   Physical Exam: Vitals:   03/03/20 1130 03/03/20 1200 03/03/20 1230 03/03/20 1300  BP: 127/83 117/72 106/76 126/75  Pulse: 87 90 72 81  Resp: (!) 25 (!) 26 (!) 23 (!) 27  Temp:      TempSrc:      SpO2: 91% 100% 95% 96%  Weight:      Height:       Constitutional: NAD, calm, comfortable, appears ill, tachypneic, lying supine on gurnee.  Does not appear distressed.   Eyes: PERRL, lids and conjunctivae normal ENMT: Mucous membranes are moist. Posterior pharynx clear of any exudate or lesions.  Normal dentition.  Neck: normal, supple, no masses, no thyromegaly Respiratory: tachypneic, no wheezing, no crackles.  Mild  increased work of breathing. No accessory muscle use.  Cardiovascular: Regular rate and rhythm, no murmurs / rubs / gallops. No extremity edema. 2+ pedal pulses. No carotid bruits.  Abdomen: no tenderness, no masses palpated. No hepatosplenomegaly. Bowel sounds positive.  Musculoskeletal: no clubbing / cyanosis. No joint deformity upper and lower extremities. Good ROM, no contractures. Normal muscle tone.  Skin: no rashes, lesions, ulcers. No induration Neurologic: CN 2-12 grossly intact. Sensation intact, DTR normal. Strength 5/5 in all 4.  Psychiatric: Normal judgment and insight. Alert and oriented x 3. Normal mood.   Labs on Admission: I have personally reviewed following labs and imaging studies  CBC: Recent Labs  Lab 03/03/20 1147  WBC 7.0  NEUTROABS 5.0  HGB 15.5  HCT 46.3  MCV 94.9  PLT 124*   Basic Metabolic Panel: Recent Labs  Lab 03/03/20 1147  NA 137  K 3.2*  CL 102  CO2 23  GLUCOSE 92  BUN 10  CREATININE 0.85  CALCIUM 8.4*   GFR: Estimated Creatinine Clearance: 135 mL/min (by C-G formula based on SCr of 0.85 mg/dL). Liver Function Tests: Recent Labs  Lab 03/03/20 1147  AST 63*  ALT 72*  ALKPHOS 37*  BILITOT 0.8  PROT 7.3  ALBUMIN 3.7   No results for input(s): LIPASE, AMYLASE in the last 168 hours. No results for input(s): AMMONIA in the last 168 hours. Coagulation Profile: No results for input(s): INR, PROTIME in the last 168 hours. Cardiac Enzymes: No results for input(s): CKTOTAL, CKMB, CKMBINDEX, TROPONINI in the last 168 hours. BNP (last 3 results) No results for input(s): PROBNP in the last 8760 hours. HbA1C: No results for input(s): HGBA1C in the last 72 hours. CBG: No results for input(s): GLUCAP in the last 168 hours. Lipid Profile: Recent Labs    03/03/20 1147  TRIG 181*   Thyroid Function Tests: No results for input(s): TSH, T4TOTAL, FREET4, T3FREE, THYROIDAB in the last 72 hours. Anemia Panel: Recent Labs    03/03/20 1147    FERRITIN 1,246*   Urine analysis:    Component Value Date/Time   COLORURINE AMBER (A) 04/25/2012 0132   APPEARANCEUR CLEAR 04/25/2012 0132   LABSPEC 1.037 (H) 04/25/2012 0132   PHURINE 6.0 04/25/2012 0132   GLUCOSEU NEGATIVE 04/25/2012 0132   HGBUR NEGATIVE 04/25/2012 0132   BILIRUBINUR SMALL (A) 04/25/2012 0132   KETONESUR >80 (A) 04/25/2012 0132   PROTEINUR 30 (A) 04/25/2012 0132   UROBILINOGEN 1.0 04/25/2012 0132   NITRITE POSITIVE (A) 04/25/2012 0132   LEUKOCYTESUR SMALL (A) 04/25/2012 0132    Radiological Exams on Admission: DG Chest Portable 1 View  Result Date: 03/03/2020 CLINICAL DATA:  Cough, COVID positive with chest congestion EXAM: PORTABLE CHEST 1 VIEW COMPARISON:  September 07, 2003 FINDINGS: Trachea  midline. Cardiomediastinal contours and hilar structures are normal. Subtle opacity a demonstrated in the RIGHT mid and lower chest. No effusion. No lobar level consolidation. On limited assessment skeletal structures without acute process. IMPRESSION: Subtle opacity in the RIGHT mid and lower chest, may be related to viral pneumonia given the patient's history. No lobar consolidation or dense opacification. Electronically Signed   By: Donzetta Kohut M.D.   On: 03/03/2020 11:44   EKG: Independently reviewed. Junctional rhythm   Assessment/Plan Principal Problem:   Acute respiratory failure with hypoxia (HCC) Active Problems:   Hypertension   GERD (gastroesophageal reflux disease)   Urinary incontinence   Pneumonia due to COVID-19 virus   Hypokalemia   Dyspnea   Migraines   Crohn disease (HCC)   Insomnia  1. Acute respiratory failure with hypoxia - secondary to Covid pneumonia - admit for IV steroids, IV remdesivir, IV fluids, Supportive care, supplemental oxygen and follow closely.   2. Covid pneumonia - Pt started on IV steroids, IV remdesivir, Zinc, Vitamin C.  Continue supplemental oxygen and IV fluids. Follow inflammatory markers closely.  Prone as much as possible.   Ambulate when able.   3. GERD - protonix ordered for GI protection.  4. Hypokalemia - oral replacement given, recheck in AM.  Following Magnesium.  5. HTN - follow and treat as needed.  He is no longer taking home medications per report.    DVT prophylaxis: lovenox  Code Status: Full   Family Communication:  Call to Mrs Meulemans, mother updated 9/1  Disposition Plan:  Home when medically stable  Consults called:   Admission status: INP   Standley Dakins MD [facility] How to contact the [facility] Attending or Consulting provider 7A - 7P or covering provider during after hours 7P -7A, for this patient?  1. Check the care team in [facility] and look for a) attending/consulting [facility] provider listed and b) the [facility] team listed 2. Log into [email or web address] and use [place]'s universal password to access. If you do not have the password, please contact the hospital operator. 3. Locate the [facility] provider you are looking for under [facility] and page to a number that you can be directly reached. 4. If you still have difficulty reaching the provider, please page the [facility] (Director on Call) for the Hospitalists listed on amion for assistance.   If 7PM-7AM, please contact night-coverage [email or web address] Password [facility]  03/03/2020, 2:32 PM

## 2020-03-04 LAB — COMPREHENSIVE METABOLIC PANEL
ALT: 63 U/L — ABNORMAL HIGH (ref 0–44)
AST: 54 U/L — ABNORMAL HIGH (ref 15–41)
Albumin: 3.4 g/dL — ABNORMAL LOW (ref 3.5–5.0)
Alkaline Phosphatase: 35 U/L — ABNORMAL LOW (ref 38–126)
Anion gap: 11 (ref 5–15)
BUN: 13 mg/dL (ref 6–20)
CO2: 25 mmol/L (ref 22–32)
Calcium: 8.2 mg/dL — ABNORMAL LOW (ref 8.9–10.3)
Chloride: 102 mmol/L (ref 98–111)
Creatinine, Ser: 0.85 mg/dL (ref 0.61–1.24)
GFR calc Af Amer: >60 mL/min (ref >60)
GFR calc non Af Amer: >60 mL/min (ref >60)
Glucose, Bld: 121 mg/dL — ABNORMAL HIGH (ref 70–99)
Potassium: 3.8 mmol/L (ref 3.5–5.1)
Sodium: 138 mmol/L (ref 135–145)
Total Bilirubin: 0.7 mg/dL (ref 0.3–1.2)
Total Protein: 6.9 g/dL (ref 6.5–8.1)

## 2020-03-04 LAB — CBC WITH DIFFERENTIAL/PLATELET
Abs Immature Granulocytes: 0.06 10*3/uL (ref 0.00–0.07)
Basophils Absolute: 0 10*3/uL (ref 0.0–0.1)
Basophils Relative: 0 %
Eosinophils Absolute: 0 10*3/uL (ref 0.0–0.5)
Eosinophils Relative: 0 %
HCT: 44.4 % (ref 39.0–52.0)
Hemoglobin: 14.7 g/dL (ref 13.0–17.0)
Immature Granulocytes: 1 %
Lymphocytes Relative: 20 %
Lymphs Abs: 1 10*3/uL (ref 0.7–4.0)
MCH: 32 pg (ref 26.0–34.0)
MCHC: 33.1 g/dL (ref 30.0–36.0)
MCV: 96.5 fL (ref 80.0–100.0)
Monocytes Absolute: 0.7 10*3/uL (ref 0.1–1.0)
Monocytes Relative: 15 %
Neutro Abs: 3.1 10*3/uL (ref 1.7–7.7)
Neutrophils Relative %: 64 %
Platelets: 154 10*3/uL (ref 150–400)
RBC: 4.6 MIL/uL (ref 4.22–5.81)
RDW: 13.1 % (ref 11.5–15.5)
WBC: 4.9 10*3/uL (ref 4.0–10.5)
nRBC: 0 % (ref 0.0–0.2)

## 2020-03-04 LAB — PHOSPHORUS: Phosphorus: 3.8 mg/dL (ref 2.5–4.6)

## 2020-03-04 LAB — MAGNESIUM: Magnesium: 2.3 mg/dL (ref 1.7–2.4)

## 2020-03-04 LAB — D-DIMER, QUANTITATIVE: D-Dimer, Quant: 0.44 ug/mL-FEU (ref 0.00–0.50)

## 2020-03-04 LAB — HIV ANTIBODY (ROUTINE TESTING W REFLEX): HIV Screen 4th Generation wRfx: NONREACTIVE

## 2020-03-04 LAB — C-REACTIVE PROTEIN: CRP: 5.2 mg/dL — ABNORMAL HIGH (ref <1.0)

## 2020-03-04 LAB — FERRITIN: Ferritin: 1266 ng/mL — ABNORMAL HIGH (ref 24–336)

## 2020-03-04 LAB — ABO/RH: ABO/RH(D): O POS

## 2020-03-04 MED ORDER — ALBUTEROL SULFATE HFA 108 (90 BASE) MCG/ACT IN AERS: 2.0000 | INHALATION_SPRAY | RESPIRATORY_TRACT | Status: DC | PRN

## 2020-03-04 MED ORDER — POTASSIUM CHLORIDE CRYS ER 20 MEQ PO TBCR
40.0000 meq | EXTENDED_RELEASE_TABLET | Freq: Every day | ORAL | Status: AC
  Administered 2020-03-04: 40 meq via ORAL
  Filled 2020-03-04: qty 2

## 2020-03-04 NOTE — Progress Notes (Signed)
PROGRESS NOTE   Kurt Lawson  HAL:[id number] DOB: 09/20/1985 DOA: 03/03/2020 PCP: Ignatius Specking, MD   Chief Complaint  Patient presents with  . Shortness of Breath    Brief Admission History:  34 y.o. male with medical history significant for GERD, HTN, migraines, PUD, aortic insufficiency, urinary incontinence, and surgical history noted below reports that for past several days to week he has been having upper respiratory symptoms, weakness, fatigue, malaise and 4 days ago he had a Covid test at a local pharmacy that came back positive.  Since that time he has had increasing shortness of breath and malaise.  He came to the ER due to increasing shortness of breath.  He was noted to be 86% on room air while resting in the ED. His Sars 2 coronavirus test was positive.  His chest xray was positive for viral appearing infiltrates consistent with Covid infection.  He was noted to be tachypneic.  He was placed on 2 L  with some improvement in symptoms.  He was started on IV steroids, IV remdesivir and admission was requested for further management of Covid pneumonia.     Assessment & Plan:   Principal Problem:   Acute respiratory failure with hypoxia (HCC) Active Problems:   Hypertension   GERD (gastroesophageal reflux disease)   Urinary incontinence   Pneumonia due to COVID-19 virus   Hypokalemia   Dyspnea   Migraines   Crohn disease (HCC)   Insomnia  1. Acute respiratory failure with hypoxia - secondary to Covid pneumonia - continue IV steroids, IV remdesivir, IV fluids, Supportive care, supplemental oxygen and follow closely.  If he remains stable, he could possibly DC home 9/3 with supplemental oxygen and arrangements for outpatient remdesivir clinic to complete treatments.   2. Covid pneumonia - Pt started on IV steroids, IV remdesivir, Zinc, Vitamin C.  Continue supplemental oxygen and IV fluids. Follow inflammatory markers closely.  Prone as much as possible.  Ambulate when able.     3. GERD - protonix ordered for GI protection.  4. Hypokalemia - oral replacement given and repleted.  Following Magnesium.  5. HTN - follow and treat as needed.  He is no longer taking home medications per report.    DVT prophylaxis: lovenox  Code Status: Full   Family Communication:  Call to Mrs Panek, mother updated 9/1  Disposition Plan:  Home when medically stable  Consults called:   Admission status: INP  Status is: Inpatient  Remains inpatient appropriate because:IV treatments appropriate due to intensity of illness or inability to take PO and Inpatient level of care appropriate due to severity of illness  Dispo: The patient is from: Home              Anticipated d/c is to: Home              Anticipated d/c date is: 1 day              Patient currently is medically stable to d/c.  Consultants:   n/a  Procedures:   N/a   Antimicrobials:  Remdesivir 9/1>>   Subjective: Pt reports that he is breathing better today.  He has not been ambulating.  He continues to have cough (mostly nonproductive)  Objective: Vitals:   03/04/20 0304 03/04/20 0409 03/04/20 0915 03/04/20 1501  BP:  135/77  124/78  Pulse:  (!) 59  64  Resp:  20  18  Temp:  98.3 F (36.8 C)  98.5 F (36.9  C)  TempSrc:  Oral  Oral  SpO2: 93% 96% 95% 94%  Weight:      Height:        Intake/Output Summary (Last 24 hours) at 03/04/2020 1554 Last data filed at 03/04/2020 0300 Gross per 24 hour  Intake 189.47 ml  Output --  Net 189.47 ml   Filed Weights   03/03/20 1107  Weight: 97.5 kg    Examination:  General exam: Awake, alert, lying supine, Appears calm and comfortable  Respiratory system: No increased work of breathing. Respiratory effort normal. Cardiovascular system: normal S1 & S2 heard, RRR. No JVD, murmurs, rubs, gallops or clicks. No pedal edema. Gastrointestinal system: Abdomen is nondistended, soft and nontender. No organomegaly or masses felt. Normal bowel sounds heard. Central  nervous system: Alert and oriented. No focal neurological deficits. Extremities: Symmetric 5 x 5 power. Skin: No rashes, lesions or ulcers Psychiatry: Judgement and insight appear normal. Mood & affect appropriate.   Data Reviewed: I have personally reviewed following labs and imaging studies  CBC: Recent Labs  Lab 03/03/20 1147 03/04/20 0618  WBC 7.0 4.9  NEUTROABS 5.0 3.1  HGB 15.5 14.7  HCT 46.3 44.4  MCV 94.9 96.5  PLT 124* 154    Basic Metabolic Panel: Recent Labs  Lab 03/03/20 1147 03/04/20 0618  NA 137 138  K 3.2* 3.8  CL 102 102  CO2 23 25  GLUCOSE 92 121*  BUN 10 13  CREATININE 0.85 0.85  CALCIUM 8.4* 8.2*  MG  --  2.3  PHOS  --  3.8    GFR: Estimated Creatinine Clearance: 135 mL/min (by C-G formula based on SCr of 0.85 mg/dL).  Liver Function Tests: Recent Labs  Lab 03/03/20 1147 03/04/20 0618  AST 63* 54*  ALT 72* 63*  ALKPHOS 37* 35*  BILITOT 0.8 0.7  PROT 7.3 6.9  ALBUMIN 3.7 3.4*    CBG: No results for input(s): GLUCAP in the last 168 hours.  Recent Results (from the past 240 hour(s))  SARS Coronavirus 2 by RT PCR (hospital order, performed in [facility] hospital lab) Nasopharyngeal Nasopharyngeal Swab     Status: Abnormal   Collection Time: 03/03/20 11:43 AM   Specimen: Nasopharyngeal Swab  Result Value Ref Range Status   SARS Coronavirus 2 POSITIVE (A) NEGATIVE Final    Comment: RESULT CALLED TO, READ BACK BY AND VERIFIED WITH: KENDRICK,J ON 03/03/20 AT 1505 BY LOY,C (NOTE) SARS-CoV-2 target nucleic acids are DETECTED  SARS-CoV-2 RNA is generally detectable in upper respiratory specimens  during the acute phase of infection.  Positive results are indicative  of the presence of the identified virus, but do not rule out bacterial infection or co-infection with other pathogens not detected by the test.  Clinical correlation with patient history and  other diagnostic information is necessary to determine patient infection status.   The expected result is negative.  Fact Sheet for Patients:   BoilerBrush.com.cy   Fact Sheet for Healthcare Providers:   https://pope.com/    This test is not yet approved or cleared by the [place] FDA and  has been authorized for detection and/or diagnosis of SARS-CoV-2 by FDA under an Emergency Use Authorization (EUA).  This EUA will remain in effect (meaning this  test can be used) for the duration of  the COVID-19 declaration under Section 564(b)(1) of the Act, 21 U.S.C. section 360-bbb-3(b)(1), unless the authorization is terminated or revoked sooner.  Performed at [facility], [address] [place], [place] [address]  Blood Culture (routine x 2)     Status: None (Preliminary result)   Collection Time: 03/03/20 12:07 PM   Specimen: BLOOD  Result Value Ref Range Status   Specimen Description BLOOD LEFT ANTECUBITAL  Final   Special Requests   Final    BOTTLES DRAWN AEROBIC AND ANAEROBIC Blood Culture adequate volume   Culture   Final    NO GROWTH < 24 HOURS Performed at [facility], [address] [place], [place] [address]    Report Status PENDING  Incomplete  Blood Culture (routine x 2)     Status: None (Preliminary result)   Collection Time: 03/03/20 12:10 PM   Specimen: BLOOD  Result Value Ref Range Status   Specimen Description BLOOD  Final   Special Requests NONE  Final   Culture   Final    NO GROWTH < 24 HOURS Performed at [facility], [address], [place], [place] [address]    Report Status PENDING  Incomplete    Radiology Studies: DG Chest Portable 1 View  Result Date: 03/03/2020 CLINICAL DATA:  Cough, COVID positive with chest congestion EXAM: PORTABLE CHEST 1 VIEW COMPARISON:  September 07, 2003 FINDINGS: Trachea midline. Cardiomediastinal contours and hilar structures are normal. Subtle opacity a demonstrated in the RIGHT mid and lower chest. No effusion. No lobar level consolidation. On limited  assessment skeletal structures without acute process. IMPRESSION: Subtle opacity in the RIGHT mid and lower chest, may be related to viral pneumonia given the patient's history. No lobar consolidation or dense opacification. Electronically Signed   By: Donzetta Kohut M.D.   On: 03/03/2020 11:44   Scheduled Meds: . vitamin C  500 mg Oral Daily  . enoxaparin (LOVENOX) injection  40 mg Subcutaneous Q24H  . Ipratropium-Albuterol  1 puff Inhalation Q6H  . methylPREDNISolone (SOLU-MEDROL) injection  0.5 mg/kg Intravenous Q12H  . pantoprazole  40 mg Oral Daily  . zinc sulfate  220 mg Oral Daily   Continuous Infusions: . sodium chloride 75 mL/hr at 03/04/20 0021  . remdesivir 100 mg in NS 100 mL 100 mg (03/04/20 1023)     LOS: 1 day   Time spent: 33 mins   Nate Perri Laural Benes, MD How to contact the [place] [facility] Attending or Consulting provider 7A - 7P or covering provider during after hours 7P -7A, for this patient?  1. Check the care team in [facility] and look for a) attending/consulting [facility] provider listed and b) the [facility] team listed 2. Log into [email or web address] and use [facility]'s universal password to access. If you do not have the password, please contact the hospital operator. 3. Locate the [facility] provider you are looking for under [facility] and page to a number that you can be directly reached. 4. If you still have difficulty reaching the provider, please page the [facility] (Director on Call) for the Hospitalists listed on amion for assistance.  03/04/2020, 3:54 PM

## 2020-03-04 NOTE — Plan of Care (Signed)
°  Problem: Education: Goal: Knowledge of General Education information will improve Description: Including pain rating scale, medication(s)/side effects and non-pharmacologic comfort measures Outcome: Progressing   Problem: Health Behavior/Discharge Planning: Goal: Ability to manage health-related needs will improve Outcome: Progressing   Problem: Education: Goal: Knowledge of risk factors and measures for prevention of condition will improve Outcome: Progressing   Problem: Coping: Goal: Psychosocial and spiritual needs will be supported Outcome: Progressing   Problem: Respiratory: Goal: Will maintain a patent airway Outcome: Progressing   

## 2020-03-04 NOTE — Progress Notes (Signed)
SATURATION QUALIFICATIONS: (This note is used to comply with regulatory documentation for home oxygen)  Patient Saturations on Room Air at Rest = 92%  Patient Saturations on Room Air while Ambulating = 88%  Patient Saturations on 2 Liters of oxygen while Ambulating = 94%  Please briefly explain why patient needs home oxygen: 

## 2020-03-05 LAB — C-REACTIVE PROTEIN: CRP: 1.5 mg/dL — ABNORMAL HIGH (ref <1.0)

## 2020-03-05 LAB — CBC WITH DIFFERENTIAL/PLATELET
Abs Immature Granulocytes: 0.08 10*3/uL — ABNORMAL HIGH (ref 0.00–0.07)
Basophils Absolute: 0 10*3/uL (ref 0.0–0.1)
Basophils Relative: 0 %
Eosinophils Absolute: 0 10*3/uL (ref 0.0–0.5)
Eosinophils Relative: 0 %
HCT: 48.2 % (ref 39.0–52.0)
Hemoglobin: 15.5 g/dL (ref 13.0–17.0)
Immature Granulocytes: 1 %
Lymphocytes Relative: 16 %
Lymphs Abs: 1 10*3/uL (ref 0.7–4.0)
MCH: 31.3 pg (ref 26.0–34.0)
MCHC: 32.2 g/dL (ref 30.0–36.0)
MCV: 97.2 fL (ref 80.0–100.0)
Monocytes Absolute: 0.7 10*3/uL (ref 0.1–1.0)
Monocytes Relative: 11 %
Neutro Abs: 4.6 10*3/uL (ref 1.7–7.7)
Neutrophils Relative %: 72 %
Platelets: 199 10*3/uL (ref 150–400)
RBC: 4.96 MIL/uL (ref 4.22–5.81)
RDW: 12.7 % (ref 11.5–15.5)
WBC: 6.3 10*3/uL (ref 4.0–10.5)
nRBC: 0 % (ref 0.0–0.2)

## 2020-03-05 LAB — COMPREHENSIVE METABOLIC PANEL
ALT: 54 U/L — ABNORMAL HIGH (ref 0–44)
AST: 40 U/L (ref 15–41)
Albumin: 3.3 g/dL — ABNORMAL LOW (ref 3.5–5.0)
Alkaline Phosphatase: 35 U/L — ABNORMAL LOW (ref 38–126)
Anion gap: 10 (ref 5–15)
BUN: 15 mg/dL (ref 6–20)
CO2: 24 mmol/L (ref 22–32)
Calcium: 8.5 mg/dL — ABNORMAL LOW (ref 8.9–10.3)
Chloride: 107 mmol/L (ref 98–111)
Creatinine, Ser: 0.72 mg/dL (ref 0.61–1.24)
GFR calc Af Amer: >60 mL/min (ref >60)
GFR calc non Af Amer: >60 mL/min (ref >60)
Glucose, Bld: 137 mg/dL — ABNORMAL HIGH (ref 70–99)
Potassium: 4 mmol/L (ref 3.5–5.1)
Sodium: 141 mmol/L (ref 135–145)
Total Bilirubin: 0.8 mg/dL (ref 0.3–1.2)
Total Protein: 6.5 g/dL (ref 6.5–8.1)

## 2020-03-05 LAB — MAGNESIUM: Magnesium: 2.6 mg/dL — ABNORMAL HIGH (ref 1.7–2.4)

## 2020-03-05 LAB — PHOSPHORUS: Phosphorus: 2.9 mg/dL (ref 2.5–4.6)

## 2020-03-05 LAB — FERRITIN: Ferritin: 1400 ng/mL — ABNORMAL HIGH (ref 24–336)

## 2020-03-05 LAB — D-DIMER, QUANTITATIVE: D-Dimer, Quant: 0.27 ug/mL-FEU (ref 0.00–0.50)

## 2020-03-05 MED ORDER — DEXAMETHASONE 6 MG PO TABS: 6.0000 mg | ORAL_TABLET | Freq: Every day | ORAL | 0 refills | Status: AC

## 2020-03-05 MED ORDER — GUAIFENESIN-DM 100-10 MG/5ML PO SYRP: 10.0000 mL | ORAL_SOLUTION | ORAL | 0 refills | Status: AC | PRN

## 2020-03-05 MED ORDER — ZINC SULFATE 220 (50 ZN) MG PO CAPS: 220.0000 mg | ORAL_CAPSULE | Freq: Every day | ORAL | 0 refills | Status: AC

## 2020-03-05 MED ORDER — ALBUTEROL SULFATE HFA 108 (90 BASE) MCG/ACT IN AERS: 2.0000 | INHALATION_SPRAY | RESPIRATORY_TRACT | 2 refills | Status: AC | PRN

## 2020-03-05 MED ORDER — ALBUTEROL SULFATE HFA 108 (90 BASE) MCG/ACT IN AERS: 2.0000 | INHALATION_SPRAY | RESPIRATORY_TRACT | 2 refills | Status: DC | PRN

## 2020-03-05 MED ORDER — ASCORBIC ACID 500 MG PO TABS: 500.0000 mg | ORAL_TABLET | Freq: Every day | ORAL | 0 refills | Status: AC

## 2020-03-05 NOTE — Discharge Summary (Signed)
Physician Discharge Summary  WHEELER INCORVAIA BMW:[id number] DOB: 10-04-1985 DOA: 03/03/2020  PCP: Ignatius Specking, MD  Admit date: 03/03/2020 Discharge date: 03/05/2020  Admitted From:  Home  Disposition: Home   Recommendations for Outpatient Follow-up:  1. Follow up with PCP in 2 weeks  Patient scheduled for outpatient Remdesivir infusions at 10am on Saturday 9/4 and Sunday 9/5 at [facility]. Discharge instrucions and progress notes placed, thanks for placing remdesivir orders Dr. Laural Benes  Home Health: DME Home oxygen   Discharge Condition: STABLE   CODE STATUS: FULL    Brief Hospitalization Summary: Please see all hospital notes, images, labs for full details of the hospitalization.  ADMISSION HPI: Kurt Lawson is a 34 y.o. male with medical history significant for GERD, HTN, migraines, PUD, aortic insufficiency, urinary incontinence, and surgical history noted below reports that for past several days to week he has been having upper respiratory symptoms, weakness, fatigue, malaise and 4 days ago he had a Covid test at a local pharmacy that came back positive.  Since that time he has had increasing shortness of breath and malaise.  He came to the ER due to increasing shortness of breath.  He was noted to be 86% on room air while resting in the ED. His Sars 2 coronavirus test was positive.  His chest xray was positive for viral appearing infiltrates consistent with Covid infection.  He was noted to be tachypneic.  He was placed on 2 L [place] with some improvement in symptoms.  He was started on IV steroids, IV remdesivir and admission was requested for further management of Covid pneumonia.      Hospital Course:  1. Acute respiratory failure with hypoxia - secondary to Covid pneumonia - admitted for IV steroids, IV remdesivir, IV fluids, Supportive care, supplemental oxygen and followed closely.   2. Covid pneumonia - Pt started on IV steroids, IV remdesivir, Zinc, Vitamin C.  Continue  supplemental oxygen as he still requires 2L [place] oxygen. Inflammatory markers trending down.  Proned as much as possible.  Ambulate when able.   He will discharge on oral dexamethasone to complete full 10 day course, he was scheduled to follow up with outpatient remdesivir clinic for 2 remaining treatments.    3. GERD - protonix ordered for GI protection.  4. Hypokalemia - oral replacement given and repleted.    5. HTN - follow up with PCP.  He is no longer taking home medications for blood pressure.    DVT prophylaxis: lovenox  Code Status: Full   Family Communication:  Call to Mrs Vorhees, mother updated 9/1  Disposition Plan:  Home  Discharge Diagnoses:  Principal Problem:   Acute respiratory failure with hypoxia (HCC) Active Problems:   Hypertension   GERD (gastroesophageal reflux disease)   Urinary incontinence   Pneumonia due to COVID-19 virus   Hypokalemia   Dyspnea   Migraines   Crohn disease (HCC)   Insomnia   Discharge Instructions:  Allergies as of 03/05/2020      Reactions   Shellfish Allergy Anaphylaxis, Hives   Aspirin Other (See Comments)   Stomach bleeding   Erythromycin Hives   Penicillins       Medication List    STOP taking these medications   amLODipine 5 MG tablet Commonly known as: NORVASC   eletriptan 40 MG tablet Commonly known as: RELPAX   pantoprazole 40 MG tablet Commonly known as: PROTONIX     TAKE these medications   acetaminophen 325 MG  tablet Commonly known as: TYLENOL Take 650 mg by mouth daily.   albuterol 108 (90 Base) MCG/ACT inhaler Commonly known as: VENTOLIN HFA Inhale 2 puffs into the lungs every 4 (four) hours as needed for wheezing or shortness of breath.   ascorbic acid 500 MG tablet Commonly known as: VITAMIN C Take 1 tablet (500 mg total) by mouth daily.   dexamethasone 6 MG tablet Commonly known as: DECADRON Take 1 tablet (6 mg total) by mouth daily for 8 days. Start taking on: March 06, 2020   Dexilant 60  MG capsule Generic drug: dexlansoprazole Take 60 mg by mouth daily.   guaiFENesin-dextromethorphan 100-10 MG/5ML syrup Commonly known as: ROBITUSSIN DM Take 10 mLs by mouth every 4 (four) hours as needed for cough.   zinc sulfate 220 (50 Zn) MG capsule Take 1 capsule (220 mg total) by mouth daily.            Durable Medical Equipment  (From admission, onward)         Start     Ordered   03/04/20 1600  For home use only DME oxygen  Once       Question Answer Comment  Length of Need 6 Months   Mode or (Route) Nasal cannula   Liters per Minute 2   Frequency Continuous (stationary and portable oxygen unit needed)   Oxygen conserving device Yes   Oxygen delivery system Gas      03/04/20 1559          Follow-up Information    Vyas, Dhruv B, MD. Schedule an appointment as soon as possible for a visit in 2 week(s).   Specialty: Internal Medicine Contact information: [address] [place] [address] [phone]              Allergies  Allergen Reactions  . Shellfish Allergy Anaphylaxis and Hives  . Aspirin Other (See Comments)    Stomach bleeding  . Erythromycin Hives  . Penicillins    Allergies as of 03/05/2020      Reactions   Shellfish Allergy Anaphylaxis, Hives   Aspirin Other (See Comments)   Stomach bleeding   Erythromycin Hives   Penicillins       Medication List    STOP taking these medications   amLODipine 5 MG tablet Commonly known as: NORVASC   eletriptan 40 MG tablet Commonly known as: RELPAX   pantoprazole 40 MG tablet Commonly known as: PROTONIX     TAKE these medications   acetaminophen 325 MG tablet Commonly known as: TYLENOL Take 650 mg by mouth daily.   albuterol 108 (90 Base) MCG/ACT inhaler Commonly known as: VENTOLIN HFA Inhale 2 puffs into the lungs every 4 (four) hours as needed for wheezing or shortness of breath.   ascorbic acid 500 MG tablet Commonly known as: VITAMIN C Take 1 tablet (500 mg total) by mouth  daily.   dexamethasone 6 MG tablet Commonly known as: DECADRON Take 1 tablet (6 mg total) by mouth daily for 8 days. Start taking on: March 06, 2020   Dexilant 60 MG capsule Generic drug: dexlansoprazole Take 60 mg by mouth daily.   guaiFENesin-dextromethorphan 100-10 MG/5ML syrup Commonly known as: ROBITUSSIN DM Take 10 mLs by mouth every 4 (four) hours as needed for cough.   zinc sulfate 220 (50 Zn) MG capsule Take 1 capsule (220 mg total) by mouth daily.            Durable Medical Equipment  (From admission, onward)  Start     Ordered   03/04/20 1600  For home use only DME oxygen  Once       Question Answer Comment  Length of Need 6 Months   Mode or (Route) Nasal cannula   Liters per Minute 2   Frequency Continuous (stationary and portable oxygen unit needed)   Oxygen conserving device Yes   Oxygen delivery system Gas      03/04/20 1559          Procedures/Studies: DG Chest Portable 1 View  Result Date: 03/03/2020 CLINICAL DATA:  Cough, COVID positive with chest congestion EXAM: PORTABLE CHEST 1 VIEW COMPARISON:  September 07, 2003 FINDINGS: Trachea midline. Cardiomediastinal contours and hilar structures are normal. Subtle opacity a demonstrated in the RIGHT mid and lower chest. No effusion. No lobar level consolidation. On limited assessment skeletal structures without acute process. IMPRESSION: Subtle opacity in the RIGHT mid and lower chest, may be related to viral pneumonia given the patient's history. No lobar consolidation or dense opacification. Electronically Signed   By: Donzetta Kohut M.D.   On: 03/03/2020 11:44      Subjective: Pt reports no specific complaints. He does well when on the oxygen.  He would like to go home today.    Discharge Exam: Vitals:   03/05/20 0309 03/05/20 0508  BP:  115/62  Pulse:  (!) 53  Resp:  20  Temp:  98.3 F (36.8 C)  SpO2: 93% 94%   Vitals:   03/04/20 2153 03/04/20 2223 03/05/20 0309 03/05/20 0508  BP:   123/75  115/62  Pulse:  (!) 59  (!) 53  Resp:  (!) 21  20  Temp:  98.8 F (37.1 C)  98.3 F (36.8 C)  TempSrc:  Oral  Oral  SpO2: 93% 92% 93% 94%  Weight:      Height:       General: Pt is alert, awake, not in acute distress Cardiovascular: normal S1/S2 +, no rubs, no gallops Respiratory:  no wheezing, no rhonchi, no increased work of breathing.  Abdominal: Soft, NT, ND, bowel sounds + Extremities: no edema, no cyanosis   The results of significant diagnostics from this hospitalization (including imaging, microbiology, ancillary and laboratory) are listed below for reference.    Microbiology: Recent Results (from the past 240 hour(s))  SARS Coronavirus 2 by RT PCR (hospital order, performed in [facility] hospital lab) Nasopharyngeal Nasopharyngeal Swab     Status: Abnormal   Collection Time: 03/03/20 11:43 AM   Specimen: Nasopharyngeal Swab  Result Value Ref Range Status   SARS Coronavirus 2 POSITIVE (A) NEGATIVE Final    Comment: RESULT CALLED TO, READ BACK BY AND VERIFIED WITH: KENDRICK,J ON 03/03/20 AT 1505 BY LOY,C (NOTE) SARS-CoV-2 target nucleic acids are DETECTED  SARS-CoV-2 RNA is generally detectable in upper respiratory specimens  during the acute phase of infection.  Positive results are indicative  of the presence of the identified virus, but do not rule out bacterial infection or co-infection with other pathogens not detected by the test.  Clinical correlation with patient history and  other diagnostic information is necessary to determine patient infection status.  The expected result is negative.  Fact Sheet for Patients:   BoilerBrush.com.cy   Fact Sheet for Healthcare Providers:   https://pope.com/    This test is not yet approved or cleared by the [place] FDA and  has been authorized for detection and/or diagnosis of SARS-CoV-2 by FDA under an Emergency Use Authorization (EUA).  This EUA will remain  in effect (meaning this  test can be used) for the duration of  the COVID-19 declaration under Section 564(b)(1) of the Act, 21 U.S.C. section 360-bbb-3(b)(1), unless the authorization is terminated or revoked sooner.  Performed at [facility], [address] [place], [place] [address]   Blood Culture (routine x 2)     Status: None (Preliminary result)   Collection Time: 03/03/20 12:07 PM   Specimen: BLOOD  Result Value Ref Range Status   Specimen Description BLOOD LEFT ANTECUBITAL  Final   Special Requests   Final    BOTTLES DRAWN AEROBIC AND ANAEROBIC Blood Culture adequate volume   Culture   Final    NO GROWTH 2 DAYS Performed at [facility], [address], [place], [place] [address]    Report Status PENDING  Incomplete  Blood Culture (routine x 2)     Status: None (Preliminary result)   Collection Time: 03/03/20 12:10 PM   Specimen: BLOOD  Result Value Ref Range Status   Specimen Description BLOOD  Final   Special Requests NONE  Final   Culture   Final    NO GROWTH 2 DAYS Performed at [facility], [address], [place], [place] [address]    Report Status PENDING  Incomplete     Labs: BNP (last 3 results) No results for input(s): BNP in the last 8760 hours. Basic Metabolic Panel: Recent Labs  Lab 03/03/20 1147 03/04/20 0618  NA 137 138  K 3.2* 3.8  CL 102 102  CO2 23 25  GLUCOSE 92 121*  BUN 10 13  CREATININE 0.85 0.85  CALCIUM 8.4* 8.2*  MG  --  2.3  PHOS  --  3.8   Liver Function Tests: Recent Labs  Lab 03/03/20 1147 03/04/20 0618  AST 63* 54*  ALT 72* 63*  ALKPHOS 37* 35*  BILITOT 0.8 0.7  PROT 7.3 6.9  ALBUMIN 3.7 3.4*   No results for input(s): LIPASE, AMYLASE in the last 168 hours. No results for input(s): AMMONIA in the last 168 hours. CBC: Recent Labs  Lab 03/03/20 1147 03/04/20 0618  WBC 7.0 4.9  NEUTROABS 5.0 3.1  HGB 15.5 14.7  HCT 46.3 44.4  MCV 94.9 96.5  PLT 124* 154   Cardiac Enzymes: No results for input(s): CKTOTAL,  CKMB, CKMBINDEX, TROPONINI in the last 168 hours. BNP: Invalid input(s): POCBNP CBG: No results for input(s): GLUCAP in the last 168 hours. D-Dimer Recent Labs    03/03/20 1147 03/04/20 0618  DDIMER 0.43 0.44   Hgb A1c No results for input(s): HGBA1C in the last 72 hours. Lipid Profile Recent Labs    03/03/20 1147  TRIG 181*   Thyroid function studies No results for input(s): TSH, T4TOTAL, T3FREE, THYROIDAB in the last 72 hours.  Invalid input(s): FREET3 Anemia work up Recent Labs    03/03/20 1147 03/04/20 0618  FERRITIN 1,246* 1,266*   Urinalysis    Component Value Date/Time   COLORURINE AMBER (A) 04/25/2012 0132   APPEARANCEUR CLEAR 04/25/2012 0132   LABSPEC 1.037 (H) 04/25/2012 0132   PHURINE 6.0 04/25/2012 0132   GLUCOSEU NEGATIVE 04/25/2012 0132   HGBUR NEGATIVE 04/25/2012 0132   BILIRUBINUR SMALL (A) 04/25/2012 0132   KETONESUR >80 (A) 04/25/2012 0132   PROTEINUR 30 (A) 04/25/2012 0132   UROBILINOGEN 1.0 04/25/2012 0132   NITRITE POSITIVE (A) 04/25/2012 0132   LEUKOCYTESUR SMALL (A) 04/25/2012 0132   Sepsis Labs Invalid input(s): PROCALCITONIN,  WBC,  LACTICIDVEN Microbiology Recent  Results (from the past 240 hour(s))  SARS Coronavirus 2 by RT PCR (hospital order, performed in [facility] hospital lab) Nasopharyngeal Nasopharyngeal Swab     Status: Abnormal   Collection Time: 03/03/20 11:43 AM   Specimen: Nasopharyngeal Swab  Result Value Ref Range Status   SARS Coronavirus 2 POSITIVE (A) NEGATIVE Final    Comment: RESULT CALLED TO, READ BACK BY AND VERIFIED WITH: KENDRICK,J ON 03/03/20 AT 1505 BY LOY,C (NOTE) SARS-CoV-2 target nucleic acids are DETECTED  SARS-CoV-2 RNA is generally detectable in upper respiratory specimens  during the acute phase of infection.  Positive results are indicative  of the presence of the identified virus, but do not rule out bacterial infection or co-infection with other pathogens not detected by the test.  Clinical  correlation with patient history and  other diagnostic information is necessary to determine patient infection status.  The expected result is negative.  Fact Sheet for Patients:   BoilerBrush.com.cy   Fact Sheet for Healthcare Providers:   https://pope.com/    This test is not yet approved or cleared by the [place] FDA and  has been authorized for detection and/or diagnosis of SARS-CoV-2 by FDA under an Emergency Use Authorization (EUA).  This EUA will remain in effect (meaning this  test can be used) for the duration of  the COVID-19 declaration under Section 564(b)(1) of the Act, 21 U.S.C. section 360-bbb-3(b)(1), unless the authorization is terminated or revoked sooner.  Performed at [facility], [address], [place], [place] [address]   Blood Culture (routine x 2)     Status: None (Preliminary result)   Collection Time: 03/03/20 12:07 PM   Specimen: BLOOD  Result Value Ref Range Status   Specimen Description BLOOD LEFT ANTECUBITAL  Final   Special Requests   Final    BOTTLES DRAWN AEROBIC AND ANAEROBIC Blood Culture adequate volume   Culture   Final    NO GROWTH 2 DAYS Performed at [facility], [address], [place], [place] [address]    Report Status PENDING  Incomplete  Blood Culture (routine x 2)     Status: None (Preliminary result)   Collection Time: 03/03/20 12:10 PM   Specimen: BLOOD  Result Value Ref Range Status   Specimen Description BLOOD  Final   Special Requests NONE  Final   Culture   Final    NO GROWTH 2 DAYS Performed at [facility], [address] [place], [place] [address]    Report Status PENDING  Incomplete   Time coordinating discharge: 40 mins  SIGNED:  Standley Dakins, MD  [facility] 03/05/2020, 7:40 AM How to contact the [facility] Attending or Consulting provider 7A - 7P or covering provider during after hours 7P -7A, for this patient?  1. Check the care team in [facility] and look  for a) attending/consulting TRH provider listed and b) the [facility] team listed 2. Log into [email or web address] and use [place]'s universal password to access. If you do not have the password, please contact the hospital operator. 3. Locate the [facility] provider you are looking for under [facility] and page to a number that you can be directly reached. 4. If you still have difficulty reaching the provider, please page the [facility] (Director on Call) for the Hospitalists listed on amion for assistance.

## 2020-03-05 NOTE — TOC Progression Note (Signed)
Transition of Care [facility]) - Progression Note    Patient Details  Name: Kurt Lawson MRN: [id number] Date of Birth: 05-25-1986  Transition of Care [facility]) CM/SW Contact  Annice Needy, LCSW Phone Number: 03/05/2020, 2:50 PM  Clinical Narrative:    COVID+, needs home oxygen, discussed with patient. Oxygen ordered.    Expected Discharge Plan: Home/Self Care Barriers to Discharge: No Barriers Identified  Expected Discharge Plan and Services Expected Discharge Plan: Home/Self Care         Expected Discharge Date: 03/05/20               DME Arranged: Oxygen DME Agency: [employer] Date DME Agency Contacted: 03/05/20 Time DME Agency Contacted: 1450 Representative spoke with at DME Agency: [facility]             Social Determinants of Health (SDOH) Interventions    Readmission Risk Interventions No flowsheet data found.

## 2020-03-05 NOTE — Progress Notes (Signed)
Nsg Discharge Note  Admit Date:  03/03/2020 Discharge date: 03/05/2020   Lynnell Grain to be D/C'd Home per MD order.  AVS completed.  Copy for chart, and copy for patient signed, and dated. Removed IVs-clean, dry, intact. Reviewed d/c paperwork with patient. Answered all questions. Instructed him on quarantining at home and following CDC guidelines on washing hands and masking. Wheeled stable patient and belongings (including oxygen tank and consolidator) to ED entrance where he drove himself home. Patient/caregiver able to verbalize understanding.  Discharge Medication: Allergies as of 03/05/2020      Reactions   Shellfish Allergy Anaphylaxis, Hives   Aspirin Other (See Comments)   Stomach bleeding   Erythromycin Hives   Penicillins       Medication List    STOP taking these medications   amLODipine 5 MG tablet Commonly known as: NORVASC   eletriptan 40 MG tablet Commonly known as: RELPAX   pantoprazole 40 MG tablet Commonly known as: PROTONIX     TAKE these medications   acetaminophen 325 MG tablet Commonly known as: TYLENOL Take 650 mg by mouth daily.   albuterol 108 (90 Base) MCG/ACT inhaler Commonly known as: VENTOLIN HFA Inhale 2 puffs into the lungs every 4 (four) hours as needed for wheezing or shortness of breath.   ascorbic acid 500 MG tablet Commonly known as: VITAMIN C Take 1 tablet (500 mg total) by mouth daily.   dexamethasone 6 MG tablet Commonly known as: DECADRON Take 1 tablet (6 mg total) by mouth daily for 8 days. Start taking on: March 06, 2020   Dexilant 60 MG capsule Generic drug: dexlansoprazole Take 60 mg by mouth daily.   guaiFENesin-dextromethorphan 100-10 MG/5ML syrup Commonly known as: ROBITUSSIN DM Take 10 mLs by mouth every 4 (four) hours as needed for cough.   zinc sulfate 220 (50 Zn) MG capsule Take 1 capsule (220 mg total) by mouth daily.            Durable Medical Equipment  (From admission, onward)         Start      Ordered   03/04/20 1600  For home use only DME oxygen  Once       Question Answer Comment  Length of Need 6 Months   Mode or (Route) Nasal cannula   Liters per Minute 2   Frequency Continuous (stationary and portable oxygen unit needed)   Oxygen conserving device Yes   Oxygen delivery system Gas      03/04/20 1559          Discharge Assessment: Vitals:   03/05/20 0309 03/05/20 0508  BP:  115/62  Pulse:  (!) 53  Resp:  20  Temp:  98.3 F (36.8 C)  SpO2: 93% 94%   Skin clean, dry and intact without evidence of skin break down, no evidence of skin tears noted. IV catheter discontinued intact. Site without signs and symptoms of complications - no redness or edema noted at insertion site, patient denies c/o pain - only slight tenderness at site.  Dressing with slight pressure applied.  D/c Instructions-Education: Discharge instructions given to patient/family with verbalized understanding. D/c education completed with patient/family including follow up instructions, medication list, d/c activities limitations if indicated, with other d/c instructions as indicated by MD - patient able to verbalize understanding, all questions fully answered. Patient instructed to return to ED, call 911, or call MD for any changes in condition.  Patient escorted via WC, and D/C home via private auto.  Karolee Ohs, RN 03/05/2020 3:36 PM

## 2020-03-05 NOTE — Progress Notes (Signed)
Patient scheduled for outpatient Remdesivir infusions at 10am on Saturday 9/4 and Sunday 9/5 at [facility]. Please inform the patient to park at [address] [place], [place], as staff will be escorting the patient through the east entrance of the hospital.    There is a wave flag banner located near the entrance on [address]. Turn into this entrance and immediately turn left and park in 1 of the 5 designated Covid Infusion Parking spots. There is a phone number on the sign, please call and let the staff know what spot you are in and we will come out and get you. For questions call [phone].  Thanks.

## 2020-03-05 NOTE — Discharge Instructions (Signed)
Patient scheduled for outpatient Remdesivir infusions at 10am on Saturday 9/4 and Sunday 9/5 at [facility]. Please inform the patient to park at [address] [place], [place], as staff will be escorting the patient through the east entrance of the hospital. The appointments will take approximately 45 minutes.    There is a wave flag banner located near the entrance on [address]. Turn into this entrance and immediately turn left and park in 1 of the 5 designated Covid Infusion Parking spots. There is a phone number on the sign, please call and let the staff know what spot you are in and we will come out and get you. For questions call [phone].  Thanks.   10 Things You Can Do to Manage Your COVID-19 Symptoms at Home If you have possible or confirmed COVID-19: 1. Stay home from work and school. And stay away from other public places. If you must go out, avoid using any kind of public transportation, ridesharing, or taxis. 2. Monitor your symptoms carefully. If your symptoms get worse, call your healthcare provider immediately. 3. Get rest and stay hydrated. 4. If you have a medical appointment, call the healthcare provider ahead of time and tell them that you have or may have COVID-19. 5. For medical emergencies, call 911 and notify the dispatch personnel that you have or may have COVID-19. 6. Cover your cough and sneezes with a tissue or use the inside of your elbow. 7. Wash your hands often with soap and water for at least 20 seconds or clean your hands with an alcohol-based hand sanitizer that contains at least 60% alcohol. 8. As much as possible, stay in a specific room and away from other people in your home. Also, you should use a separate bathroom, if available. If you need to be around other people in or outside of the home, wear a mask. 9. Avoid sharing personal items with other people in your household, like dishes, towels, and bedding. 10. Clean all surfaces that are touched  often, like counters, tabletops, and doorknobs. Use household cleaning sprays or wipes according to the label instructions. SouthAmericaFlowers.co.uk 01/01/2019 This information is not intended to replace advice given to you by your health care provider. Make sure you discuss any questions you have with your health care provider. Document Revised: 06/05/2019 Document Reviewed: 06/05/2019 Elsevier Patient Education  2020 [employer].   COVID-19 Frequently Asked Questions COVID-19 (coronavirus disease) is an infection that is caused by a large family of viruses. Some viruses cause illness in people and others cause illness in animals like camels, cats, and bats. In some cases, the viruses that cause illness in animals can spread to humans. Where did the coronavirus come from? In December 2019, [place] told the [employer] [facility]) of several cases of lung disease (human respiratory illness). These cases were linked to an open seafood and livestock market in the city of [place]. The link to the seafood and livestock market suggests that the virus may have spread from animals to humans. However, since that first outbreak in December, the virus has also been shown to spread from person to person. What is the name of the disease and the virus? Disease name Early on, this disease was called novel coronavirus. This is because scientists determined that the disease was caused by a new (novel) respiratory virus. The World Health Organization [facility]) has now named the disease COVID-19, or coronavirus disease. Virus name The virus that causes the disease is called  severe acute respiratory syndrome coronavirus 2 (SARS-CoV-2). More information on disease and virus naming World Health Organization [facility]): www.who.int/emergencies/diseases/novel-coronavirus-2019/technical-guidance/naming-the-coronavirus-disease-(covid-2019)-and-the-virus-that-causes-it Who is at risk for complications from coronavirus  disease? Some people may be at higher risk for complications from coronavirus disease. This includes older adults and people who have chronic diseases, such as heart disease, diabetes, and lung disease. If you are at higher risk for complications, take these extra precautions:  Stay home as much as possible.  Avoid social gatherings and travel.  Avoid close contact with others. Stay at least 6 ft (2 m) away from others, if possible.  Wash your hands often with soap and water for at least 20 seconds.  Avoid touching your face, mouth, nose, or eyes.  Keep supplies on hand at home, such as food, medicine, and cleaning supplies.  If you must go out in public, wear a cloth face covering or face mask. Make sure your mask covers your nose and mouth. How does coronavirus disease spread? The virus that causes coronavirus disease spreads easily from person to person (is contagious). You may catch the virus by:  Breathing in droplets from an infected person. Droplets can be spread by a person breathing, speaking, singing, coughing, or sneezing.  Touching something, like a table or a doorknob, that was exposed to the virus (contaminated) and then touching your mouth, nose, or eyes. Can I get the virus from touching surfaces or objects? There is still a lot that we do not know about the virus that causes coronavirus disease. Scientists are basing a lot of information on what they know about similar viruses, such as:  Viruses cannot generally survive on surfaces for long. They need a human body (host) to survive.  It is more likely that the virus is spread by close contact with people who are sick (direct contact), such as through: ? Shaking hands or hugging. ? Breathing in respiratory droplets that travel through the air. Droplets can be spread by a person breathing, speaking, singing, coughing, or sneezing.  It is less likely that the virus is spread when a person touches a surface or object that  has the virus on it (indirect contact). The virus may be able to enter the body if the person touches a surface or object and then touches his or her face, eyes, nose, or mouth. Can a person spread the virus without having symptoms of the disease? It may be possible for the virus to spread before a person has symptoms of the disease, but this is most likely not the main way the virus is spreading. It is more likely for the virus to spread by being in close contact with people who are sick and breathing in the respiratory droplets spread by a person breathing, speaking, singing, coughing, or sneezing. What are the symptoms of coronavirus disease? Symptoms vary from person to person and can range from mild to severe. Symptoms may include:  Fever or chills.  Cough.  Difficulty breathing or feeling short of breath.  Headaches, body aches, or muscle aches.  Runny or stuffy (congested) nose.  Sore throat.  New loss of taste or smell.  Nausea, vomiting, or diarrhea. These symptoms can appear anywhere from 2 to 14 days after you have been exposed to the virus. Some people may not have any symptoms. If you develop symptoms, call your health care provider. People with severe symptoms may need hospital care. Should I be tested for this virus? Your health care provider will decide whether to test  you based on your symptoms, history of exposure, and your risk factors. How does a health care provider test for this virus? Health care providers will collect samples to send for testing. Samples may include:  Taking a swab of fluid from the back of your nose and throat, your nose, or your throat.  Taking fluid from the lungs by having you cough up mucus (sputum) into a sterile cup.  Taking a blood sample. Is there a treatment or vaccine for this virus? Currently, there is no vaccine to prevent coronavirus disease. Also, there are no medicines like antibiotics or antivirals to treat the virus. A person  who becomes sick is given supportive care, which means rest and fluids. A person may also relieve his or her symptoms by using over-the-counter medicines that treat sneezing, coughing, and runny nose. These are the same medicines that a person takes for the common cold. If you develop symptoms, call your health care provider. People with severe symptoms may need hospital care. What can I do to protect myself and my family from this virus?     You can protect yourself and your family by taking the same actions that you would take to prevent the spread of other viruses. Take the following actions:  Wash your hands often with soap and water for at least 20 seconds. If soap and water are not available, use alcohol-based hand sanitizer.  Avoid touching your face, mouth, nose, or eyes.  Cough or sneeze into a tissue, sleeve, or elbow. Do not cough or sneeze into your hand or the air. ? If you cough or sneeze into a tissue, throw it away immediately and wash your hands.  Disinfect objects and surfaces that you frequently touch every day.  Stay away from people who are sick.  Avoid going out in public, follow guidance from your state and local health authorities.  Avoid crowded indoor spaces. Stay at least 6 ft (2 m) away from others.  If you must go out in public, wear a cloth face covering or face mask. Make sure your mask covers your nose and mouth.  Stay home if you are sick, except to get medical care. Call your health care provider before you get medical care. Your health care provider will tell you how long to stay home.  Make sure your vaccines are up to date. Ask your health care provider what vaccines you need. What should I do if I need to travel? Follow travel recommendations from your local health authority, the CDC, and WHO. Travel information and advice  Centers for Disease Control and Prevention (CDC): GeminiCard.gl  World Health  Organization [facility]): PreviewDomains.se Know the risks and take action to protect your health  You are at higher risk of getting coronavirus disease if you are traveling to areas with an outbreak or if you are exposed to travelers from areas with an outbreak.  Wash your hands often and practice good hygiene to lower the risk of catching or spreading the virus. What should I do if I am sick? General instructions to stop the spread of infection  Wash your hands often with soap and water for at least 20 seconds. If soap and water are not available, use alcohol-based hand sanitizer.  Cough or sneeze into a tissue, sleeve, or elbow. Do not cough or sneeze into your hand or the air.  If you cough or sneeze into a tissue, throw it away immediately and wash your hands.  Stay home unless you must  get medical care. Call your health care provider or local health authority before you get medical care.  Avoid public areas. Do not take public transportation, if possible.  If you can, wear a mask if you must go out of the house or if you are in close contact with someone who is not sick. Make sure your mask covers your nose and mouth. Keep your home clean  Disinfect objects and surfaces that are frequently touched every day. This may include: ? Counters and tables. ? Doorknobs and light switches. ? Sinks and faucets. ? Electronics such as phones, remote controls, keyboards, computers, and tablets.  Wash dishes in hot, soapy water or use a dishwasher. Air-dry your dishes.  Wash laundry in hot water. Prevent infecting other household members  Let healthy household members care for children and pets, if possible. If you have to care for children or pets, wash your hands often and wear a mask.  Sleep in a different bedroom or bed, if possible.  Do not share personal items, such as razors, toothbrushes, deodorant, combs, brushes, towels, and  washcloths. Where to find more information Centers for Disease Control and Prevention (CDC)  InCardRetirement.czw.cdc.gov/coronavirus/2019-ncov World Health Organization [facility])  Information and news updates: AffordableSalon.es  Coronavirus health topic: https://thompson-craig.com/  Questions and answers on COVID-19: kruiseway.com  Global tracker: who.sprinklr.com American Academy of Pediatrics (AAP)  Information for families: www.healthychildren.org/English/health-issues/conditions/chest-lungs/Pages/2019-Novel-Coronavirus.aspx The coronavirus situation is changing rapidly. Check your local health authority website or the CDC and [facility] websites for updates and news. When should I contact a health care provider?  Contact your health care provider if you have symptoms of an infection, such as fever or cough, and you: ? Have been near anyone who is known to have coronavirus disease. ? Have come into contact with a person who is suspected to have coronavirus disease. ? Have traveled to an area where there is an outbreak of COVID-19. When should I get emergency medical care?  Get help right away by calling your local emergency services (911 in the [place] if you have: ? Trouble breathing. ? Pain or pressure in your chest. ? Confusion. ? Blue-tinged lips and fingernails. ? Difficulty waking from sleep. ? Symptoms that get worse. Let the emergency medical personnel know if you think you have coronavirus disease. Summary  A new respiratory virus is spreading from person to person and causing COVID-19 (coronavirus disease).  The virus that causes COVID-19 appears to spread easily. It spreads from one person to another through droplets from breathing, speaking, singing, coughing, or sneezing.  Older adults and those with chronic diseases are at higher risk of disease. If you are at higher risk for  complications, take extra precautions.  There is currently no vaccine to prevent coronavirus disease. There are no medicines, such as antibiotics or antivirals, to treat the virus.  You can protect yourself and your family by washing your hands often, avoiding touching your face, and covering your coughs and sneezes. This information is not intended to replace advice given to you by your health care provider. Make sure you discuss any questions you have with your health care provider. Document Revised: 04/18/2019 Document Reviewed: 10/15/2018 Elsevier Patient Education  2020 Elsevier Inc.  COVID-19: Quarantine vs. Isolation QUARANTINE keeps someone who was in close contact with someone who has COVID-19 away from others. If you had close contact with a person who has COVID-19  Stay home until 14 days after your last contact.  Check your temperature  twice a day and watch for symptoms of COVID-19.  If possible, stay away from people who are at higher-risk for getting very sick from COVID-19. ISOLATION keeps someone who is sick or tested positive for COVID-19 without symptoms away from others, even in their own home. If you are sick and think or know you have COVID-19  Stay home until after ? At least 10 days since symptoms first appeared and ? At least 24 hours with no fever without fever-reducing medication and ? Symptoms have improved If you tested positive for COVID-19 but do not have symptoms  Stay home until after ? 10 days have passed since your positive test If you live with others, stay in a specific "sick room" or area and away from other people or animals, including pets. Use a separate bathroom, if available. SouthAmericaFlowers.co.uk 01/20/2019 This information is not intended to replace advice given to you by your health care provider. Make sure you discuss any questions you have with your health care provider. Document Revised: 06/05/2019 Document Reviewed: 06/05/2019 Elsevier  Patient Education  2020 Elsevier Inc.  Prevent the Spread of COVID-19 if You Are Sick If you are sick with COVID-19 or think you might have COVID-19, follow the steps below to care for yourself and to help protect other people in your home and community. Stay home except to get medical care.  Stay home. Most people with COVID-19 have mild illness and are able to recover at home without medical care. Do not leave your home, except to get medical care. Do not visit public areas.  Take care of yourself. Get rest and stay hydrated. Take over-the-counter medicines, such as acetaminophen, to help you feel better.  Stay in touch with your doctor. Call before you get medical care. Be sure to get care if you have trouble breathing, or have any other emergency warning signs, or if you think it is an emergency.  Avoid public transportation, ride-sharing, or taxis. Separate yourself from other people and pets in your home.  As much as possible, stay in a specific room and away from other people and pets in your home. Also, you should use a separate bathroom, if available. If you need to be around other people or animals in or outside of the home, wear a mask. ? See COVID-19 and Animals if you have questions about AnniversaryBlowout.com.ee. ? Additional guidance is available for those living in close quarters. (http://white.info/.html) and shared housing (DisasterTalk.co.uk). Monitor your symptoms.  Symptoms of COVID-19 include fever, cough, and shortness of breath but other symptoms may be present as well.  Follow care instructions from your healthcare provider and local health department. Your local health authorities will give instructions on checking your symptoms and reporting information. When to Seek Emergency Medical  Attention Look for emergency warning signs* for COVID-19. If someone is showing any of these signs, seek emergency medical care immediately:  Trouble breathing  Persistent pain or pressure in the chest  New confusion  Bluish lips or face  Inability to wake or stay awake *This list is not all possible symptoms. Please call your medical provider for any other symptoms that are severe or concerning to you. Call 911 or call ahead to your local emergency facility: Notify the operator that you are seeking care for someone who has or may have COVID-19. Call ahead before visiting your doctor.  Call ahead. Many medical visits for routine care are being postponed or done by phone or telemedicine.  If you have a  medical appointment that cannot be postponed, call your doctor's office, and tell them you have or may have COVID-19. If you are sick, wear a mask over your nose and mouth.  You should wear a mask over your nose and mouth if you must be around other people or animals, including pets (even at home).  You don't need to wear the mask if you are alone. If you can't put on a mask (because of trouble breathing for example), cover your coughs and sneezes in some other way. Try to stay at least 6 feet away from other people. This will help protect the people around you.  Masks should not be placed on young children under age 57 years, anyone who has trouble breathing, or anyone who is not able to remove the mask without help. Note: During the COVID-19 pandemic, medical grade facemasks are reserved for healthcare workers and some first responders. You may need to make a mask using a scarf or bandana. Cover your coughs and sneezes.  Cover your mouth and nose with a tissue when you cough or sneeze.  Throw used tissues in a lined trash can.  Immediately wash your hands with soap and water for at least 20 seconds. If soap and water are not available, clean your hands with an alcohol-based hand  sanitizer that contains at least 60% alcohol. Clean your hands often.  Wash your hands often with soap and water for at least 20 seconds. This is especially important after blowing your nose, coughing, or sneezing; going to the bathroom; and before eating or preparing food.  Use hand sanitizer if soap and water are not available. Use an alcohol-based hand sanitizer with at least 60% alcohol, covering all surfaces of your hands and rubbing them together until they feel dry.  Soap and water are the best option, especially if your hands are visibly dirty.  Avoid touching your eyes, nose, and mouth with unwashed hands. Avoid sharing personal household items.  Do not share dishes, drinking glasses, cups, eating utensils, towels, or bedding with other people in your home.  Wash these items thoroughly after using them with soap and water or put them in the dishwasher. Clean all "high-touch" surfaces everyday.  Clean and disinfect high-touch surfaces in your "sick room" and bathroom. Let someone else clean and disinfect surfaces in common areas, but not your bedroom and bathroom.  If a caregiver or other person needs to clean and disinfect a sick person's bedroom or bathroom, they should do so on an as-needed basis. The caregiver/other person should wear a mask and wait as long as possible after the sick person has used the bathroom. High-touch surfaces include phones, remote controls, counters, tabletops, doorknobs, bathroom fixtures, toilets, keyboards, tablets, and bedside tables.  Clean and disinfect areas that may have blood, stool, or body fluids on them.  Use household cleaners and disinfectants. Clean the area or item with soap and water or another detergent if it is dirty. Then use a household disinfectant. ? Be sure to follow the instructions on the label to ensure safe and effective use of the product. Many products recommend keeping the surface wet for several minutes to ensure germs are  killed. Many also recommend precautions such as wearing gloves and making sure you have good ventilation during use of the product. ? Most EPA-registered household disinfectants should be effective. When you can be around others after you had or likely had COVID-19 When you can be around others (end home isolation) depends on  different factors for different situations.  I think or know I had COVID-19, and I had symptoms ? You can be with others after  24 hours with no fever AND  Symptoms improved AND  10 days since symptoms first appeared ? Depending on your healthcare provider's advice and availability of testing, you might get tested to see if you still have COVID-19. If you will be tested, you can be around others when you have no fever, symptoms have improved, and you receive two negative test results in a row, at least 24 hours apart.  I tested positive for COVID-19 but had no symptoms ? If you continue to have no symptoms, you can be with others after:  10 days have passed since test ? Depending on your healthcare provider's advice and availability of testing, you might get tested to see if you still have COVID-19. If you will be tested, you can be around others after you receive two negative test results in a row, at least 24 hours apart. ? If you develop symptoms after testing positive, follow the guidance above for "I think or know I had COVID, and I had symptoms." SouthAmericaFlowers.co.uk 02/11/2019 This information is not intended to replace advice given to you by your health care provider. Make sure you discuss any questions you have with your health care provider. Document Revised: 02/27/2019 Document Reviewed: 12/31/2018 Elsevier Patient Education  2020 Elsevier Inc.   Prone Position Therapy  Prone position therapy, also called prone positioning, is when a person is put on his or her stomach to make breathing easier. This therapy usually takes place in a hospital intensive  care unit (ICU). Most people will be asleep during the therapy, but sometimes, the person is awake. During this therapy, the person will be on his or her stomach for as long as possible. This may be for 16 or more hours each day. Often, this therapy is used when a person:  Has acute respiratory distress syndrome (ARDS). This condition is very serious because fluid collects in the lungs. When this happens, the lungs cannot fill with air like normal and oxygen cannot pass to the blood.  Requires a ventilator to help him or her breathe. A ventilator is a machine that moves air in and out of the lungs. Prone position therapy may make it easier for oxygen to pass to the blood in the lungs. It may also prevent injury to the lungs from being on a ventilator. What are the risks? The risks may include:  Tubes becoming disconnected or blocked. Examples include breathing tubes, drains, or IVs.  Pressure injuries to the skin and tissue. This happens when skin presses against a surface, such as a mattress, for too long.  Nerve injuries from being in one position for a long time.  Buildup of fluid in the face.  Inhaling food, saliva, or liquid into the lungs (aspirating).  Not responding to this therapy and the condition gets worse instead of better. What happens before the treatment? Your health care provider will make sure it is safe to perform the therapy. To do this, your health care provider may check:  Your blood pressure, heart rate, breathing rate, and blood oxygen level.  Your skin. A protective barrier will be applied to your face and any areas that may have a lot of pressure during therapy.  Your level of alertness and pain.  Any tubes connected to you. All lines, tubes, and drains will be secured. You may be given medicines  to:  Help you relax (sedatives).  Control blood pressure.  Control pain. What happens during the treatment?  Your health care providers, or a special moving  bed, will lift and turn you onto your stomach.  Certain bedding will be used to help prevent pressure injuries. This may include a pillow, pad, mattress, or cushion that is filled with gel, air, water, or foam.  Your head, arms, and legs will be moved into different positions every hour.  Any medical devices and braces will be adjusted as needed to limit pressure on your skin. Your health care providers may also place padding between the skin and the device.  Your health care providers will regularly check: ? Your skin for swelling and signs of pressure injuries. ? Your breathing tubes, drains, or IVs to make sure these are still in place and not blocked. ? Your blood to measure the levels of oxygen and carbon dioxide (arterial blood gases test). This test tells your health care provider if the therapy is helping. ? Your blood pressure, heart rate, breathing rate, and blood oxygen level. ? Your ventilator to make sure that all connections are secure and that the machine is working correctly. Ventilators have alarms that go off if something needs to be checked. The procedure may vary among health care providers and hospitals. What can I expect after the treatment?  You will be moved from your stomach to your back.  More arterial blood gases tests will be done to make sure the treatment helped.  Your blood pressure, heart rate, breathing rate, and blood oxygen level will be monitored. Summary  Prone position therapy, also called prone positioning, is when a person is put on his or her stomach to make breathing easier.  Prone position therapy may make it easier for oxygen to pass to the blood in the lungs. It may also prevent injury to the lungs from the ventilator.  Your health care providers, or a special moving bed, will lift and turn you onto your stomach. This information is not intended to replace advice given to you by your health care provider. Make sure you discuss any questions  you have with your health care provider. Document Revised: 03/07/2019 Document Reviewed: 03/07/2019 Elsevier Patient Education  2020 [employer]    IMPORTANT INFORMATION: PAY CLOSE ATTENTION   PHYSICIAN DISCHARGE INSTRUCTIONS  Follow with Primary care provider  Ignatius Specking, MD  and other consultants as instructed by your Hospitalist Physician  SEEK MEDICAL CARE OR RETURN TO EMERGENCY ROOM IF SYMPTOMS COME BACK, WORSEN OR NEW PROBLEM DEVELOPS   Please note: You were cared for by a hospitalist during your hospital stay. Every effort will be made to forward records to your primary care provider.  You can request that your primary care provider send for your hospital records if they have not received them.  Once you are discharged, your primary care physician will handle any further medical issues. Please note that NO REFILLS for any discharge medications will be authorized once you are discharged, as it is imperative that you return to your primary care physician (or establish a relationship with a primary care physician if you do not have one) for your post hospital discharge needs so that they can reassess your need for medications and monitor your lab values.  Please get a complete blood count and chemistry panel checked by your Primary MD at your next visit, and again as instructed by your Primary MD.  Get Medicines reviewed and adjusted:  Please take all your medications with you for your next visit with your Primary MD  Laboratory/radiological data: Please request your Primary MD to go over all hospital tests and procedure/radiological results at the follow up, please ask your primary care provider to get all Hospital records sent to his/her office.  In some cases, they will be blood work, cultures and biopsy results pending at the time of your discharge. Please request that your primary care provider follow up on these results.  If you are diabetic, please bring your blood sugar  readings with you to your follow up appointment with primary care.    Please call and make your follow up appointments as soon as possible.    Also Note the following: If you experience worsening of your admission symptoms, develop shortness of breath, life threatening emergency, suicidal or homicidal thoughts you must seek medical attention immediately by calling 911 or calling your MD immediately  if symptoms less severe.  You must read complete instructions/literature along with all the possible adverse reactions/side effects for all the Medicines you take and that have been prescribed to you. Take any new Medicines after you have completely understood and accpet all the possible adverse reactions/side effects.   Do not drive when taking Pain medications or sleeping medications (Benzodiazepines)  Do not take more than prescribed Pain, Sleep and Anxiety Medications. It is not advisable to combine anxiety,sleep and pain medications without talking with your primary care practitioner  Special Instructions: If you have smoked or chewed Tobacco  in the last 2 yrs please stop smoking, stop any regular Alcohol  and or any Recreational drug use.  Wear Seat belts while driving.  Do not drive if taking any narcotic, mind altering or controlled substances or recreational drugs or alcohol.

## 2020-03-05 NOTE — Plan of Care (Signed)
  Problem: Education: Goal: Knowledge of General Education information will improve Description: Including pain rating scale, medication(s)/side effects and non-pharmacologic comfort measures 03/05/2020 1534 by Karolee Ohs, RN Outcome: Adequate for Discharge 03/05/2020 1525 by Karolee Ohs, RN Outcome: Progressing   Problem: Health Behavior/Discharge Planning: Goal: Ability to manage health-related needs will improve 03/05/2020 1534 by Karolee Ohs, RN Outcome: Adequate for Discharge 03/05/2020 1525 by Karolee Ohs, RN Outcome: Progressing   Problem: Clinical Measurements: Goal: Ability to maintain clinical measurements within normal limits will improve Outcome: Adequate for Discharge Goal: Will remain free from infection Outcome: Adequate for Discharge Goal: Diagnostic test results will improve 03/05/2020 1534 by Karolee Ohs, RN Outcome: Adequate for Discharge 03/05/2020 1525 by Karolee Ohs, RN Outcome: Progressing Goal: Respiratory complications will improve 03/05/2020 1534 by Karolee Ohs, RN Outcome: Adequate for Discharge 03/05/2020 1525 by Karolee Ohs, RN Outcome: Progressing Goal: Cardiovascular complication will be avoided Outcome: Adequate for Discharge   Problem: Activity: Goal: Risk for activity intolerance will decrease Outcome: Adequate for Discharge   Problem: Nutrition: Goal: Adequate nutrition will be maintained Outcome: Adequate for Discharge   Problem: Coping: Goal: Level of anxiety will decrease Outcome: Adequate for Discharge   Problem: Elimination: Goal: Will not experience complications related to bowel motility Outcome: Adequate for Discharge Goal: Will not experience complications related to urinary retention Outcome: Adequate for Discharge   Problem: Pain Managment: Goal: General experience of comfort will improve Outcome: Adequate for Discharge   Problem: Safety: Goal: Ability to remain free from injury will improve Outcome:  Adequate for Discharge   Problem: Skin Integrity: Goal: Risk for impaired skin integrity will decrease Outcome: Adequate for Discharge   Problem: Education: Goal: Knowledge of risk factors and measures for prevention of condition will improve Outcome: Adequate for Discharge   Problem: Coping: Goal: Psychosocial and spiritual needs will be supported Outcome: Adequate for Discharge   Problem: Respiratory: Goal: Will maintain a patent airway Outcome: Adequate for Discharge Goal: Complications related to the disease process, condition or treatment will be avoided or minimized Outcome: Adequate for Discharge

## 2020-03-05 NOTE — Plan of Care (Signed)

## 2020-03-06 ENCOUNTER — Ambulatory Visit (HOSPITAL_COMMUNITY)
Admit: 2020-03-06 | Discharge: 2020-03-06 | Disposition: A | Discharge status: Home / Self Care | Payer: Managed Care, Other (non HMO) | Source: Ambulatory Visit | Attending: Pulmonary Disease | Admitting: Pulmonary Disease

## 2020-03-06 DIAGNOSIS — U071 COVID-19: Secondary | ICD-10-CM | POA: Diagnosis present

## 2020-03-06 DIAGNOSIS — J1289 Other viral pneumonia: Secondary | ICD-10-CM | POA: Insufficient documentation

## 2020-03-06 MED ORDER — EPINEPHRINE 0.3 MG/0.3ML IJ SOAJ: 0.3000 mg | Freq: Once | INTRAMUSCULAR | Status: DC | PRN

## 2020-03-06 MED ORDER — SODIUM CHLORIDE 0.9 % IV SOLN
100.0000 mg | Freq: Once | INTRAVENOUS | Status: AC
  Administered 2020-03-06: 100 mg via INTRAVENOUS
  Filled 2020-03-06: qty 20

## 2020-03-06 MED ORDER — FAMOTIDINE IN NACL 20-0.9 MG/50ML-% IV SOLN: 20.0000 mg | Freq: Once | INTRAVENOUS | Status: DC | PRN

## 2020-03-06 MED ORDER — ALBUTEROL SULFATE HFA 108 (90 BASE) MCG/ACT IN AERS: 2.0000 | INHALATION_SPRAY | Freq: Once | RESPIRATORY_TRACT | Status: DC | PRN

## 2020-03-06 MED ORDER — METHYLPREDNISOLONE SODIUM SUCC 125 MG IJ SOLR: 125.0000 mg | Freq: Once | INTRAMUSCULAR | Status: DC | PRN

## 2020-03-06 MED ORDER — DIPHENHYDRAMINE HCL 50 MG/ML IJ SOLN: 50.0000 mg | Freq: Once | INTRAMUSCULAR | Status: DC | PRN

## 2020-03-06 MED ORDER — SODIUM CHLORIDE 0.9 % IV SOLN: INTRAVENOUS | Status: DC | PRN

## 2020-03-06 NOTE — Discharge Instructions (Signed)

## 2020-03-06 NOTE — Progress Notes (Signed)
  Diagnosis: COVID-19  Physician:Dr. Wright   Procedure: [facility]: remdesivir infusion - Provided patient with remdesivir fact sheet for patients, parents and caregivers prior to infusion.  Complications: No immediate complications noted.  Discharge: Discharged home   Kurt Firmin  [place] 03/06/2020   

## 2020-03-07 ENCOUNTER — Ambulatory Visit (HOSPITAL_COMMUNITY)
Admit: 2020-03-07 | Discharge: 2020-03-07 | Disposition: A | Discharge status: Home / Self Care | Payer: Managed Care, Other (non HMO) | Attending: Pulmonary Disease | Admitting: Pulmonary Disease

## 2020-03-07 DIAGNOSIS — U071 COVID-19: Secondary | ICD-10-CM | POA: Insufficient documentation

## 2020-03-07 DIAGNOSIS — J1282 Pneumonia due to coronavirus disease 2019: Secondary | ICD-10-CM | POA: Diagnosis not present

## 2020-03-07 MED ORDER — SODIUM CHLORIDE 0.9 % IV SOLN: INTRAVENOUS | Status: DC | PRN

## 2020-03-07 MED ORDER — METHYLPREDNISOLONE SODIUM SUCC 125 MG IJ SOLR: 125.0000 mg | Freq: Once | INTRAMUSCULAR | Status: DC | PRN

## 2020-03-07 MED ORDER — EPINEPHRINE 0.3 MG/0.3ML IJ SOAJ: 0.3000 mg | Freq: Once | INTRAMUSCULAR | Status: DC | PRN

## 2020-03-07 MED ORDER — DIPHENHYDRAMINE HCL 50 MG/ML IJ SOLN: 50.0000 mg | Freq: Once | INTRAMUSCULAR | Status: DC | PRN

## 2020-03-07 MED ORDER — ALBUTEROL SULFATE HFA 108 (90 BASE) MCG/ACT IN AERS: 2.0000 | INHALATION_SPRAY | Freq: Once | RESPIRATORY_TRACT | Status: DC | PRN

## 2020-03-07 MED ORDER — SODIUM CHLORIDE 0.9 % IV SOLN
100.0000 mg | Freq: Once | INTRAVENOUS | Status: AC
  Administered 2020-03-07: 100 mg via INTRAVENOUS
  Filled 2020-03-07: qty 20

## 2020-03-07 MED ORDER — FAMOTIDINE IN NACL 20-0.9 MG/50ML-% IV SOLN: 20.0000 mg | Freq: Once | INTRAVENOUS | Status: DC | PRN

## 2020-03-07 NOTE — Progress Notes (Signed)
  Diagnosis: COVID-19  Physician:Dr Wright   Procedure: [facility] Med: remdesivir infusion - Provided patient with remdesivir fact sheet for patients, parents and caregivers prior to infusion.  Complications: No immediate complications noted.  Discharge: Discharged home   Verlon Pischke W 03/07/2020  

## 2020-03-07 NOTE — Discharge Instructions (Signed)
10 Things You Can Do to Manage Your COVID-19 Symptoms at Home If you have possible or confirmed COVID-19: 1. Stay home from work and school. And stay away from other public places. If you must go out, avoid using any kind of public transportation, ridesharing, or taxis. 2. Monitor your symptoms carefully. If your symptoms get worse, call your healthcare provider immediately. 3. Get rest and stay hydrated. 4. If you have a medical appointment, call the healthcare provider ahead of time and tell them that you have or may have COVID-19. 5. For medical emergencies, call 911 and notify the dispatch personnel that you have or may have COVID-19. 6. Cover your cough and sneezes with a tissue or use the inside of your elbow. 7. Wash your hands often with soap and water for at least 20 seconds or clean your hands with an alcohol-based hand sanitizer that contains at least 60% alcohol. 8. As much as possible, stay in a specific room and away from other people in your home. Also, you should use a separate bathroom, if available. If you need to be around other people in or outside of the home, wear a mask. 9. Avoid sharing personal items with other people in your household, like dishes, towels, and bedding. 10. Clean all surfaces that are touched often, like counters, tabletops, and doorknobs. Use household cleaning sprays or wipes according to the label instructions. cdc.gov/coronavirus 01/01/2019 This information is not intended to replace advice given to you by your health care provider. Make sure you discuss any questions you have with your health care provider. Document Revised: 06/05/2019 Document Reviewed: 06/05/2019 Elsevier Patient Education  2020 [employer]  

## 2020-03-08 LAB — CULTURE, BLOOD (ROUTINE X 2)
Culture: NO GROWTH
Culture: NO GROWTH
Special Requests: ADEQUATE
